# Patient Record
Sex: Female | Born: 1980 | Race: White | Hispanic: No | Marital: Single | Smoking: Former smoker
Health system: Southern US, Community
[De-identification: ages and names within clinical notes are randomized; demographics above are authoritative.]

## PROBLEM LIST (undated history)

## (undated) DIAGNOSIS — F319 Bipolar disorder, unspecified: Secondary | ICD-10-CM

## (undated) DIAGNOSIS — Q8789 Other specified congenital malformation syndromes, not elsewhere classified: Secondary | ICD-10-CM

## (undated) DIAGNOSIS — F988 Other specified behavioral and emotional disorders with onset usually occurring in childhood and adolescence: Secondary | ICD-10-CM

## (undated) DIAGNOSIS — M5430 Sciatica, unspecified side: Secondary | ICD-10-CM

## (undated) DIAGNOSIS — Q528 Other specified congenital malformations of female genitalia: Secondary | ICD-10-CM

## (undated) HISTORY — DX: Other specified congenital malformation syndromes, not elsewhere classified: Q87.89

## (undated) HISTORY — DX: Other specified behavioral and emotional disorders with onset usually occurring in childhood and adolescence: F98.8

## (undated) HISTORY — DX: Other specified congenital malformations of female genitalia: Q52.8

## (undated) HISTORY — PX: OOPHORECTOMY: SHX86

## (undated) HISTORY — DX: Bipolar disorder, unspecified: F31.9

## (undated) HISTORY — PX: MANDIBLE RECONSTRUCTION: SHX431

## (undated) HISTORY — PX: HIP SURGERY: SHX245

---

## 1999-05-08 ENCOUNTER — Emergency Department (HOSPITAL_COMMUNITY): Admission: EM | Admit: 1999-05-08 | Discharge: 1999-05-08 | Payer: Self-pay | Admitting: Emergency Medicine

## 1999-05-08 ENCOUNTER — Encounter: Payer: Self-pay | Admitting: Emergency Medicine

## 1999-09-19 ENCOUNTER — Encounter: Payer: Self-pay | Admitting: Family Medicine

## 1999-09-19 ENCOUNTER — Ambulatory Visit (HOSPITAL_COMMUNITY): Admission: RE | Admit: 1999-09-19 | Discharge: 1999-09-19 | Payer: Self-pay | Admitting: Family Medicine

## 2001-04-14 ENCOUNTER — Emergency Department (HOSPITAL_COMMUNITY): Admission: EM | Admit: 2001-04-14 | Discharge: 2001-04-14 | Payer: Self-pay | Admitting: Internal Medicine

## 2001-04-14 ENCOUNTER — Encounter: Payer: Self-pay | Admitting: Internal Medicine

## 2001-09-26 ENCOUNTER — Emergency Department (HOSPITAL_COMMUNITY): Admission: AC | Admit: 2001-09-26 | Discharge: 2001-09-27 | Payer: Self-pay

## 2001-09-26 ENCOUNTER — Encounter: Payer: Self-pay | Admitting: Surgery

## 2001-09-26 ENCOUNTER — Encounter: Payer: Self-pay | Admitting: Emergency Medicine

## 2002-11-12 ENCOUNTER — Emergency Department (HOSPITAL_COMMUNITY): Admission: EM | Admit: 2002-11-12 | Discharge: 2002-11-12 | Payer: Self-pay | Admitting: Emergency Medicine

## 2003-01-02 ENCOUNTER — Encounter: Payer: Self-pay | Admitting: Emergency Medicine

## 2003-01-02 ENCOUNTER — Emergency Department (HOSPITAL_COMMUNITY): Admission: EM | Admit: 2003-01-02 | Discharge: 2003-01-02 | Payer: Self-pay | Admitting: Emergency Medicine

## 2003-07-09 ENCOUNTER — Encounter: Payer: Self-pay | Admitting: Obstetrics and Gynecology

## 2003-07-09 ENCOUNTER — Ambulatory Visit (HOSPITAL_COMMUNITY): Admission: RE | Admit: 2003-07-09 | Discharge: 2003-07-09 | Payer: Self-pay | Admitting: Obstetrics and Gynecology

## 2003-07-22 ENCOUNTER — Inpatient Hospital Stay (HOSPITAL_COMMUNITY): Admission: RE | Admit: 2003-07-22 | Discharge: 2003-07-24 | Payer: Self-pay | Admitting: Obstetrics and Gynecology

## 2003-07-22 ENCOUNTER — Encounter (INDEPENDENT_AMBULATORY_CARE_PROVIDER_SITE_OTHER): Payer: Self-pay | Admitting: Specialist

## 2003-11-29 ENCOUNTER — Emergency Department (HOSPITAL_COMMUNITY): Admission: EM | Admit: 2003-11-29 | Discharge: 2003-11-29 | Payer: Self-pay | Admitting: Emergency Medicine

## 2004-10-05 ENCOUNTER — Ambulatory Visit: Payer: Self-pay | Admitting: Internal Medicine

## 2005-01-25 ENCOUNTER — Emergency Department (HOSPITAL_COMMUNITY): Admission: EM | Admit: 2005-01-25 | Discharge: 2005-01-25 | Payer: Self-pay | Admitting: Emergency Medicine

## 2005-11-27 ENCOUNTER — Emergency Department (HOSPITAL_COMMUNITY): Admission: EM | Admit: 2005-11-27 | Discharge: 2005-11-27 | Payer: Self-pay | Admitting: Emergency Medicine

## 2006-04-29 ENCOUNTER — Emergency Department (HOSPITAL_COMMUNITY): Admission: EM | Admit: 2006-04-29 | Discharge: 2006-04-29 | Payer: Self-pay | Admitting: Emergency Medicine

## 2007-05-05 ENCOUNTER — Inpatient Hospital Stay (HOSPITAL_COMMUNITY): Admission: EM | Admit: 2007-05-05 | Discharge: 2007-05-10 | Payer: Self-pay | Admitting: Emergency Medicine

## 2008-03-12 ENCOUNTER — Emergency Department (HOSPITAL_COMMUNITY): Admission: EM | Admit: 2008-03-12 | Discharge: 2008-03-12 | Payer: Self-pay | Admitting: Emergency Medicine

## 2008-04-12 ENCOUNTER — Emergency Department (HOSPITAL_COMMUNITY): Admission: EM | Admit: 2008-04-12 | Discharge: 2008-04-12 | Payer: Self-pay | Admitting: Emergency Medicine

## 2008-05-10 ENCOUNTER — Emergency Department (HOSPITAL_COMMUNITY): Admission: EM | Admit: 2008-05-10 | Discharge: 2008-05-10 | Payer: Self-pay | Admitting: Emergency Medicine

## 2008-08-23 ENCOUNTER — Emergency Department (HOSPITAL_COMMUNITY): Admission: EM | Admit: 2008-08-23 | Discharge: 2008-08-23 | Payer: Self-pay | Admitting: Emergency Medicine

## 2008-09-20 ENCOUNTER — Emergency Department (HOSPITAL_COMMUNITY): Admission: EM | Admit: 2008-09-20 | Discharge: 2008-09-20 | Payer: Self-pay | Admitting: Emergency Medicine

## 2008-10-10 ENCOUNTER — Emergency Department (HOSPITAL_COMMUNITY): Admission: EM | Admit: 2008-10-10 | Discharge: 2008-10-10 | Payer: Self-pay | Admitting: Emergency Medicine

## 2009-12-03 ENCOUNTER — Emergency Department (HOSPITAL_COMMUNITY): Admission: EM | Admit: 2009-12-03 | Discharge: 2009-12-03 | Payer: Self-pay | Admitting: Emergency Medicine

## 2009-12-19 ENCOUNTER — Emergency Department (HOSPITAL_COMMUNITY): Admission: EM | Admit: 2009-12-19 | Discharge: 2009-12-19 | Payer: Self-pay | Admitting: Emergency Medicine

## 2010-01-29 ENCOUNTER — Emergency Department (HOSPITAL_COMMUNITY): Admission: EM | Admit: 2010-01-29 | Discharge: 2010-01-29 | Payer: Self-pay | Admitting: Emergency Medicine

## 2010-01-30 ENCOUNTER — Emergency Department (HOSPITAL_COMMUNITY): Admission: EM | Admit: 2010-01-30 | Discharge: 2010-01-30 | Payer: Self-pay | Admitting: Emergency Medicine

## 2010-07-29 ENCOUNTER — Emergency Department (HOSPITAL_COMMUNITY): Admission: EM | Admit: 2010-07-29 | Discharge: 2010-07-29 | Payer: Self-pay | Admitting: Emergency Medicine

## 2010-12-02 ENCOUNTER — Emergency Department (HOSPITAL_COMMUNITY)
Admission: EM | Admit: 2010-12-02 | Discharge: 2010-12-02 | Payer: Self-pay | Source: Home / Self Care | Admitting: Emergency Medicine

## 2011-03-13 LAB — CBC
HCT: 44.9 % (ref 36.0–46.0)
MCHC: 34.7 g/dL (ref 30.0–36.0)
Platelets: 217 10*3/uL (ref 150–400)
RDW: 12.8 % (ref 11.5–15.5)
WBC: 7.1 10*3/uL (ref 4.0–10.5)

## 2011-03-13 LAB — BASIC METABOLIC PANEL
BUN: 11 mg/dL (ref 6–23)
Calcium: 9.6 mg/dL (ref 8.4–10.5)
Creatinine, Ser: 0.72 mg/dL (ref 0.4–1.2)
GFR calc non Af Amer: >60 mL/min (ref >60)
Glucose, Bld: 108 mg/dL — ABNORMAL HIGH (ref 70–99)
Potassium: 3.8 mEq/L (ref 3.5–5.1)

## 2011-05-12 ENCOUNTER — Emergency Department (HOSPITAL_COMMUNITY)
Admission: EM | Admit: 2011-05-12 | Discharge: 2011-05-13 | Disposition: A | Discharge status: Home / Self Care | Payer: Self-pay | Attending: Emergency Medicine | Admitting: Emergency Medicine

## 2011-05-12 DIAGNOSIS — R071 Chest pain on breathing: Secondary | ICD-10-CM | POA: Insufficient documentation

## 2011-05-12 DIAGNOSIS — R079 Chest pain, unspecified: Secondary | ICD-10-CM | POA: Insufficient documentation

## 2011-05-12 DIAGNOSIS — I498 Other specified cardiac arrhythmias: Secondary | ICD-10-CM | POA: Insufficient documentation

## 2011-05-12 DIAGNOSIS — F411 Generalized anxiety disorder: Secondary | ICD-10-CM | POA: Insufficient documentation

## 2011-05-12 DIAGNOSIS — R109 Unspecified abdominal pain: Secondary | ICD-10-CM | POA: Insufficient documentation

## 2011-05-13 ENCOUNTER — Emergency Department (HOSPITAL_COMMUNITY): Payer: Self-pay

## 2011-05-13 LAB — CBC
HCT: 43.1 % (ref 36.0–46.0)
Hemoglobin: 14.7 g/dL (ref 12.0–15.0)
MCHC: 34.1 g/dL (ref 30.0–36.0)
MCV: 88.9 fL (ref 78.0–100.0)
RDW: 13 % (ref 11.5–15.5)

## 2011-05-13 LAB — DIFFERENTIAL
Basophils Absolute: 0 10*3/uL (ref 0.0–0.1)
Basophils Relative: 0 % (ref 0–1)
Lymphocytes Relative: 28 % (ref 12–46)
Monocytes Absolute: 0.5 10*3/uL (ref 0.1–1.0)
Neutro Abs: 6.2 10*3/uL (ref 1.7–7.7)

## 2011-05-13 LAB — COMPREHENSIVE METABOLIC PANEL
ALT: 18 U/L (ref 0–35)
AST: 19 U/L (ref 0–37)
Albumin: 4.4 g/dL (ref 3.5–5.2)
CO2: 26 mEq/L (ref 19–32)
Calcium: 9.3 mg/dL (ref 8.4–10.5)
Chloride: 100 mEq/L (ref 96–112)
GFR calc Af Amer: >60 mL/min (ref >60)
GFR calc non Af Amer: >60 mL/min (ref >60)
Sodium: 137 mEq/L (ref 135–145)
Total Bilirubin: 0.3 mg/dL (ref 0.3–1.2)

## 2011-05-13 LAB — URINALYSIS, ROUTINE W REFLEX MICROSCOPIC
Bilirubin Urine: NEGATIVE
Glucose, UA: NEGATIVE mg/dL
Hgb urine dipstick: NEGATIVE
Specific Gravity, Urine: 1.018 (ref 1.005–1.030)
Urobilinogen, UA: 0.2 mg/dL (ref 0.0–1.0)

## 2011-05-13 LAB — D-DIMER, QUANTITATIVE: D-Dimer, Quant: <0.22 ug/mL-FEU (ref 0.00–0.48)

## 2011-05-13 LAB — POCT PREGNANCY, URINE: Preg Test, Ur: NEGATIVE

## 2011-05-18 NOTE — Op Note (Signed)
NAME:  Debra Curtis, Debra Curtis                    ACCOUNT NO.:  [id number]   MEDICAL RECORD NO.:  [id number]                   PATIENT TYPE:  INP   LOCATION:  9303                                 FACILITY:  [facility]   PHYSICIAN:  Miguel Aschoff, M.D.                    DATE OF BIRTH:  [birth year]   DATE OF PROCEDURE:  07/22/2003  DATE OF DISCHARGE:                                 OPERATIVE REPORT   PREOPERATIVE DIAGNOSIS:  Androgen insensitivity syndrome, removal of gonads  for malignant potential.   POSTOPERATIVE DIAGNOSIS:  Androgen insensitivity syndrome, removal of gonads  for malignant potential.   OPERATION PERFORMED:  Laparoscopy with bilateral orchidectomy.   SURGEON:  Miguel Aschoff, M.D.   ASSISTANT:  Randye Lobo, M.D.   ANESTHESIA:  General.   COMPLICATIONS:  None.   INDICATIONS FOR PROCEDURE:  The patient is a 30 year old white female with  androgen insensitivity syndrome diagnosed at age 57.  The patient now being  22 and the 25% risk of malignancy due to an intra-abdominal gonad, she is  proceeding at this point to undergo removal of gonads in an effort to avoid  this malignant potential. The risks and benefits of this procedure were  discussed with the patient and informed consent has been obtained.   DESCRIPTION OF PROCEDURE:  The patient was taken to the operating room and  placed in the supine position.  General anesthesia was administered without  difficulty.  She was then placed in dorsal lithotomy position, prepped and  draped in the usual sterile fashion.  Bladder was catheterized.  Examination  under anesthesia revealed normal external female genitalia, normal vaginal  vault ending in a blind cuff.  The cervix and uterus were absent.  After  this examination was carried out, attention was directed to the abdomen  where a small infraumbilical incision was made.  The Veress needle was  inserted and then the abdomen was insufflated with 3L of CO2.  Following  this,  a trocar to the laparoscope was placed followed by the laparoscope  itself.  To allow better visualization, two additional port sites were  placed under direct visualization.  A 12 mm right lower quadrant port site  was established as well as a 5 mm left lower quadrant port site.  Systematic  inspection revealed absence of the cervix, uterus, tubes and ovaries.  The  liver surface appeared to be unremarkable, intestinal surfaces appeared to  be unremarkable.  The appendix was visualized and noted to be within normal  limits.  Gonads were visualized just at the exit point at the inguinal ring  but these were intra-abdominal.  No abnormalities or evidence of any  malignant degeneration was noted.  At this point the ureters were identified  as well as the iliac artery and vein and then on the left side, the vessels  into the left gonad were visualized, placed on stretch and then using  tripolar forceps, the vessels were coagulated, then cut.  It was then  possible to dissect the gonad back towards the opening of the internal  inguinal ring until the base was found.  At this point using the tripolar  cautery forceps, it was possible to coagulate and cut, thus freeing the left  gonad.  Inspection of the operative site at this point revealed good  hemostasis and again no evidence of any injury to the pelvic vessels or the  ureter.  A similar procedure was then identically carried out on the right  side.  The vessels entering the gonad were then identified, placed on  stretch and cauterized with the tripolar forceps and then cut.  This was  done without difficulty and then the dissection was carried back towards the  opening of the internal inguinal ring using the tripolar forceps.  The final  pedicle which was holding the gonad in at the level of the inguinal ring was  then placed on stretch and cauterized with tripolar forceps and then cut.  At this point with both gonads being free, an EndoCatch  bag was placed into  the pelvis and the gonads were placed into the EndoCatch bag.  This was then  poked through the 12 mm port site and held up against the abdominal wall.  At this point the small right lower quadrant incision was enlarged by about  1 inch.  The fascia was identified and the incision in the fascia was  extended and it was possible using an EndoCatch bag to remove the specimens  for histologic study.  At this point a final inspection was made for  hemostasis.  Hemostasis appeared to be excellent and at this point the  procedure was completed.  The right lower quadrant incision was closed.  The  peritoneum was then closed and then the fascial incision which was  approximately 3 cm in size was closed using running interlocking 0 Vicryl  suture.  Then the small laparoscopic incisions were closed using  subcuticular 4-0 Vicryl.  The procedure being completed, the patient was  taken to the recovery room in satisfactory condition.  She is to be observed  overnight and will be sent home on June 23, 2003.  The estimated blood loss  was less than 30mL.  The patient tolerated the procedure well.                                                Miguel Aschoff, M.D.    AR/MEDQ  D:  07/22/2003  T:  07/23/2003  Job:  [id number]
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
## 2011-05-18 NOTE — Discharge Summary (Signed)
   NAME:  Debra Curtis, Debra Curtis                    ACCOUNT NO.:  [id number]   MEDICAL RECORD NO.:  [id number]                   PATIENT TYPE:  INP   LOCATION:  9303                                 FACILITY:  [facility]   PHYSICIAN:  Miguel Aschoff, M.D.                    DATE OF BIRTH:  01-25-1981   DATE OF ADMISSION:  07/22/2003  DATE OF DISCHARGE:  07/24/2003                                 DISCHARGE SUMMARY   ADMISSION DIAGNOSIS:  Androgen insensitivity syndrome.   FINAL DIAGNOSIS:  Androgen insensitivity syndrome.   OPERATIONS AND PROCEDURES:  Laparoscopy with bilateral gonadectomy, general  anesthesia.   BRIEF HISTORY:  The patient is a 30 year old white female with documented  androgen insensitivity syndrome diagnosed at age 35.  Because of the  patient's diagnosis an increased risk for development of carcinoma of the  gonads and with the now completing her full adult growth, she is being taken  to the operating room to undergo gonadectomy in effort to avoid the  development of a malignancy of the gonads.   HOSPITAL COURSE:  Preoperative studies were obtained.  This included  admission hemoglobin of 16.5, white count of 8000.  Chemistry profile was  within normal limits.  PT and PTT were within normal limits.  With general  anesthesia, laparoscopy was carried out and it was possible to perform a  laparoscopic bilateral gonadectomy without difficulty.  The patient was  admitted following surgery but had nausea and required intravenous fluids  and was kept in house until July 24, 2003. She was able to be discharged  home on the second postoperative day in satisfactory condition.  Her  hemoglobin stabilized at 13.7.  She was discharged home in satisfactory  condition.  Instructed to no heavy lifting for approximately three weeks,  call if there are any problems such as fever, pain, or heavy bleeding.  Medications for home included Tylox one every three hours if needed for  pain, Reglan 10  mg one every six hours if needed for nausea, and Climara  patches 0.1 mg.  The final pathology report revealed benign gonads.  There  is no evidence of spermatogenesis or evidence of malignancy.                                               Miguel Aschoff, M.D.    AR/MEDQ  D:  08/23/2003  T:  08/23/2003  Job:  [id number]
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
## 2011-06-29 ENCOUNTER — Emergency Department (HOSPITAL_COMMUNITY)
Admission: EM | Admit: 2011-06-29 | Discharge: 2011-06-30 | Disposition: A | Discharge status: Home / Self Care | Payer: Self-pay | Attending: Emergency Medicine | Admitting: Emergency Medicine

## 2011-06-29 DIAGNOSIS — R059 Cough, unspecified: Secondary | ICD-10-CM | POA: Insufficient documentation

## 2011-06-29 DIAGNOSIS — R093 Abnormal sputum: Secondary | ICD-10-CM | POA: Insufficient documentation

## 2011-06-29 DIAGNOSIS — R05 Cough: Secondary | ICD-10-CM | POA: Insufficient documentation

## 2011-06-29 DIAGNOSIS — J45909 Unspecified asthma, uncomplicated: Secondary | ICD-10-CM | POA: Insufficient documentation

## 2011-06-29 DIAGNOSIS — R0989 Other specified symptoms and signs involving the circulatory and respiratory systems: Secondary | ICD-10-CM | POA: Insufficient documentation

## 2011-06-29 DIAGNOSIS — J40 Bronchitis, not specified as acute or chronic: Secondary | ICD-10-CM | POA: Insufficient documentation

## 2011-06-29 DIAGNOSIS — R079 Chest pain, unspecified: Secondary | ICD-10-CM | POA: Insufficient documentation

## 2011-06-29 DIAGNOSIS — F17200 Nicotine dependence, unspecified, uncomplicated: Secondary | ICD-10-CM | POA: Insufficient documentation

## 2011-06-30 ENCOUNTER — Emergency Department (HOSPITAL_COMMUNITY): Payer: Self-pay

## 2011-07-12 ENCOUNTER — Emergency Department (HOSPITAL_COMMUNITY)
Admission: EM | Admit: 2011-07-12 | Discharge: 2011-07-12 | Disposition: A | Discharge status: Home / Self Care | Payer: Self-pay | Attending: Emergency Medicine | Admitting: Emergency Medicine

## 2011-07-12 DIAGNOSIS — F411 Generalized anxiety disorder: Secondary | ICD-10-CM | POA: Insufficient documentation

## 2011-07-12 DIAGNOSIS — Z0000 Encounter for general adult medical examination without abnormal findings: Secondary | ICD-10-CM | POA: Insufficient documentation

## 2011-07-12 DIAGNOSIS — R002 Palpitations: Secondary | ICD-10-CM | POA: Insufficient documentation

## 2011-07-12 DIAGNOSIS — R0789 Other chest pain: Secondary | ICD-10-CM | POA: Insufficient documentation

## 2011-07-12 DIAGNOSIS — R0609 Other forms of dyspnea: Secondary | ICD-10-CM | POA: Insufficient documentation

## 2011-07-12 DIAGNOSIS — R071 Chest pain on breathing: Secondary | ICD-10-CM | POA: Insufficient documentation

## 2011-07-12 DIAGNOSIS — R0989 Other specified symptoms and signs involving the circulatory and respiratory systems: Secondary | ICD-10-CM | POA: Insufficient documentation

## 2011-09-24 LAB — URINALYSIS, ROUTINE W REFLEX MICROSCOPIC
Glucose, UA: NEGATIVE
Ketones, ur: NEGATIVE
Nitrite: POSITIVE — AB
Specific Gravity, Urine: 1.015
pH: 6.5

## 2011-09-24 LAB — I-STAT 8, (EC8 V) (CONVERTED LAB)
BUN: 11
Bicarbonate: 24.2 — ABNORMAL HIGH
Chloride: 107
Glucose, Bld: 132 — ABNORMAL HIGH
HCT: 44
Operator id: 288331
pCO2, Ven: 39.3 — ABNORMAL LOW
pH, Ven: 7.396 — ABNORMAL HIGH

## 2011-09-24 LAB — URINE MICROSCOPIC-ADD ON

## 2011-09-26 LAB — POCT PREGNANCY, URINE
Operator id: 288831
Preg Test, Ur: NEGATIVE

## 2011-10-01 LAB — URINALYSIS, ROUTINE W REFLEX MICROSCOPIC
Glucose, UA: NEGATIVE
Nitrite: NEGATIVE
Specific Gravity, Urine: 1.015
pH: 6

## 2011-10-01 LAB — DIFFERENTIAL
Basophils Absolute: 0
Basophils Relative: 1
Lymphocytes Relative: 25
Monocytes Relative: 5
Neutro Abs: 5.5
Neutrophils Relative %: 67

## 2011-10-01 LAB — POCT I-STAT, CHEM 8
BUN: 21
Chloride: 104
Glucose, Bld: 133 — ABNORMAL HIGH
HCT: 44
Potassium: 3.8

## 2011-10-01 LAB — POCT PREGNANCY, URINE: Preg Test, Ur: NEGATIVE

## 2011-10-01 LAB — CBC
Hemoglobin: 14.2
MCHC: 33.7
RBC: 4.6

## 2011-10-24 ENCOUNTER — Emergency Department (HOSPITAL_COMMUNITY): Payer: Self-pay

## 2011-10-24 ENCOUNTER — Emergency Department (HOSPITAL_COMMUNITY)
Admission: EM | Admit: 2011-10-24 | Discharge: 2011-10-24 | Disposition: A | Discharge status: Home / Self Care | Payer: Self-pay | Attending: Emergency Medicine | Admitting: Emergency Medicine

## 2011-10-24 DIAGNOSIS — M7989 Other specified soft tissue disorders: Secondary | ICD-10-CM | POA: Insufficient documentation

## 2011-10-24 DIAGNOSIS — S62309A Unspecified fracture of unspecified metacarpal bone, initial encounter for closed fracture: Secondary | ICD-10-CM | POA: Insufficient documentation

## 2011-10-24 DIAGNOSIS — Z8659 Personal history of other mental and behavioral disorders: Secondary | ICD-10-CM | POA: Insufficient documentation

## 2011-10-24 DIAGNOSIS — M79609 Pain in unspecified limb: Secondary | ICD-10-CM | POA: Insufficient documentation

## 2011-10-24 DIAGNOSIS — S6990XA Unspecified injury of unspecified wrist, hand and finger(s), initial encounter: Secondary | ICD-10-CM | POA: Insufficient documentation

## 2011-10-24 DIAGNOSIS — S60229A Contusion of unspecified hand, initial encounter: Secondary | ICD-10-CM | POA: Insufficient documentation

## 2011-10-24 DIAGNOSIS — F319 Bipolar disorder, unspecified: Secondary | ICD-10-CM | POA: Insufficient documentation

## 2011-10-24 DIAGNOSIS — Y92009 Unspecified place in unspecified non-institutional (private) residence as the place of occurrence of the external cause: Secondary | ICD-10-CM | POA: Insufficient documentation

## 2011-10-24 DIAGNOSIS — W208XXA Other cause of strike by thrown, projected or falling object, initial encounter: Secondary | ICD-10-CM | POA: Insufficient documentation

## 2014-04-01 ENCOUNTER — Emergency Department (HOSPITAL_COMMUNITY)
Admission: EM | Admit: 2014-04-01 | Discharge: 2014-04-01 | Disposition: A | Discharge status: Home / Self Care | Payer: Self-pay | Attending: Emergency Medicine | Admitting: Emergency Medicine

## 2014-04-01 ENCOUNTER — Encounter (HOSPITAL_COMMUNITY): Payer: Self-pay | Admitting: Emergency Medicine

## 2014-04-01 DIAGNOSIS — Z79899 Other long term (current) drug therapy: Secondary | ICD-10-CM | POA: Insufficient documentation

## 2014-04-01 DIAGNOSIS — Z5189 Encounter for other specified aftercare: Secondary | ICD-10-CM

## 2014-04-01 DIAGNOSIS — Z760 Encounter for issue of repeat prescription: Secondary | ICD-10-CM | POA: Insufficient documentation

## 2014-04-01 DIAGNOSIS — F17200 Nicotine dependence, unspecified, uncomplicated: Secondary | ICD-10-CM | POA: Insufficient documentation

## 2014-04-01 DIAGNOSIS — Z4801 Encounter for change or removal of surgical wound dressing: Secondary | ICD-10-CM | POA: Insufficient documentation

## 2014-04-01 MED ORDER — OXYCODONE-ACETAMINOPHEN 10-325 MG PO TABS: 1.0000 | ORAL_TABLET | ORAL | Status: DC | PRN

## 2014-04-01 MED ORDER — SILVER SULFADIAZINE 1 % EX CREA
TOPICAL_CREAM | Freq: Every day | CUTANEOUS | Status: DC
  Administered 2014-04-01: 19:00:00 via TOPICAL
  Filled 2014-04-01: qty 85

## 2014-04-01 MED ORDER — SILVER SULFADIAZINE 1 % EX CREA: 1.0000 "application " | TOPICAL_CREAM | Freq: Every day | CUTANEOUS | Status: DC

## 2014-04-01 NOTE — Discharge Instructions (Signed)
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
## 2014-04-03 NOTE — ED Provider Notes (Signed)
Medical screening examination/treatment/procedure(s) were performed by non-physician practitioner and as supervising physician I was immediately available for consultation/collaboration.   EKG Interpretation None        Kiyana Vazguez J. Michelina Mexicano, MD 04/03/14 0857 

## 2014-12-04 ENCOUNTER — Encounter (HOSPITAL_COMMUNITY): Payer: Self-pay | Admitting: Emergency Medicine

## 2014-12-04 ENCOUNTER — Emergency Department (HOSPITAL_COMMUNITY)
Admission: EM | Admit: 2014-12-04 | Discharge: 2014-12-04 | Disposition: A | Discharge status: Home / Self Care | Payer: Self-pay | Attending: Emergency Medicine | Admitting: Emergency Medicine

## 2014-12-04 DIAGNOSIS — M5416 Radiculopathy, lumbar region: Secondary | ICD-10-CM | POA: Insufficient documentation

## 2014-12-04 DIAGNOSIS — Z79899 Other long term (current) drug therapy: Secondary | ICD-10-CM | POA: Insufficient documentation

## 2014-12-04 DIAGNOSIS — Z87828 Personal history of other (healed) physical injury and trauma: Secondary | ICD-10-CM | POA: Insufficient documentation

## 2014-12-04 DIAGNOSIS — Z720 Tobacco use: Secondary | ICD-10-CM | POA: Insufficient documentation

## 2014-12-04 DIAGNOSIS — Z7952 Long term (current) use of systemic steroids: Secondary | ICD-10-CM | POA: Insufficient documentation

## 2014-12-04 MED ORDER — HYDROCODONE-ACETAMINOPHEN 5-325 MG PO TABS: ORAL_TABLET | ORAL | Status: DC

## 2014-12-04 MED ORDER — HYDROCODONE-ACETAMINOPHEN 5-325 MG PO TABS
1.0000 | ORAL_TABLET | Freq: Once | ORAL | Status: AC
  Administered 2014-12-04: 1 via ORAL
  Filled 2014-12-04: qty 1

## 2014-12-04 MED ORDER — METHOCARBAMOL 500 MG PO TABS: 1000.0000 mg | ORAL_TABLET | Freq: Four times a day (QID) | ORAL | Status: DC | PRN

## 2014-12-04 MED ORDER — KETOROLAC TROMETHAMINE 60 MG/2ML IM SOLN
30.0000 mg | Freq: Once | INTRAMUSCULAR | Status: AC
  Administered 2014-12-04: 30 mg via INTRAMUSCULAR
  Filled 2014-12-04: qty 2

## 2014-12-04 MED ORDER — PREDNISONE 50 MG PO TABS: ORAL_TABLET | ORAL | Status: DC

## 2014-12-04 NOTE — ED Notes (Signed)
Pt reports back pain since Thursday. Has had back pain before, but not this bad. Pt has lower back pain with radiation down R hip. ambulatory to triage room

## 2014-12-04 NOTE — Discharge Instructions (Signed)
Please take ibuprofen 400mg  (this is normally 2 over the counter pills) every 6 hours (take with food to minimze stomach irritation).   Take robaxin and/or Vicodin for breakthrough pain, do not drink alcohol, drive, care for children or perfom other critical tasks while taking robaxin and/or Vicodin .  Please follow with your primary care doctor in the next 2 days for a check-up. They must obtain records for further management.     Do not hesitate to return to the emergency room for any new, worsening or concerning symptoms.  Please obtain primary care using resource guide below. But the minute you were seen in the emergency room and that they will need to obtain records for further outpatient management.  Ermelinda Das.reso  Emergency Department Resource Guide 1) Find a Doctor and Pay Out of Pocket Although you won't have to find out who is covered by your insurance plan, it is a good idea to ask around and get recommendations. You will then need to call the office and see if the doctor you have chosen will accept you as a new patient and what types of options they offer for patients who are self-pay. Some doctors offer discounts or will set up payment plans for their patients who do not have insurance, but you will need to ask so you aren't surprised when you get to your appointment.  2) Contact Your Local Health Department Not all health departments have doctors that can see patients for sick visits, but many do, so it is worth a call to see if yours does. If you don't know where your local health department is, you can check in your phone book. The CDC also has a tool to help you locate your state's health department, and many state websites also have listings of all of their local health departments.  3) Find a Walk-in Clinic If your illness is not likely to be very severe or complicated, you may want to try a walk in clinic. These are popping up all over the country in pharmacies, drugstores, and  shopping centers. They're usually staffed by nurse practitioners or physician assistants that have been trained to treat common illnesses and complaints. They're usually fairly quick and inexpensive. However, if you have serious medical issues or chronic medical problems, these are probably not your best option.  No Primary Care Doctor: - Call Health Connect at  [phone] - they can help you locate a primary care doctor that  accepts your insurance, provides certain services, etc. - Physician Referral Service- [phone]  Chronic Pain Problems: Organization         Address  Phone   Notes  [facility]  [phone] Patients need to be referred by their primary care doctor.   Medication Assistance: Organization         Address  Phone   Notes  [facility] Medication [facility] [address] [place], [place] [id number] [phone] --Must be a resident of [place] -- Must have NO insurance coverage whatsoever (no Medicaid/ Medicare, etc.) -- The pt. MUST have a primary care doctor that directs their care regularly and follows them in the community   MedAssist  [phone]   [employer]  [phone]    Agencies that provide inexpensive medical care: Organization         Address  Phone   Notes  [facility]  [phone]   [facility]    (  [phone]   [facility] [place], [place] [address] [phone]   [place] [address] [place] [phone]   Planned Parenthood    [phone]   [facility]    [phone]   [place] and [place] [address], [place] Phone:  [phone], Fax:  [phone] Hours of Operation:  9 am - 6 pm, M-F.  Also accepts Medicaid/Medicare and self-pay.  [facility] for [address], [address], [place] Phone: [phone],  Fax: [phone]. Hours of Operation:  8:30 am - 5:30 pm, M-F.  Also accepts Medicaid and self-pay.  [facility] [place] [address] [place] Phone: [phone]   [place], [place], [place] [phone], Ext. [phone] Mondays & Thursdays: 7-9 AM.  First 15 patients are seen on a first come, first serve basis.    [place] Providers:  Organization         Address  Phone   Notes  [facility] [address], [place] [phone] Also accepts self-pay patients.  [facility] [id number] [place], [place]  [phone]   [place], [address], [place] [phone]   [facility] [address] [place] [phone]   Lucianne Lei [address], [place]   [phone] Only accepts [place] Access [place] patients after they have their name applied to their card.   Self-Pay (no insurance) in [facility]:  Organization         Address  Phone   Notes  Sickle Cell Patients, [facility] [place] [phone]   [facility] [place] [phone]   [facility] [place]  [place], [place], [place] [phone]   [facility]/Dr. Osei-Bonsu  [address] [place] or [address], [place] [phone] Phone number for both [place] and [place] locations is the same.  [facility] [address], [place] [phone]   [facility] [address] [place] or [address] [phone]   [facility] [address] [place] [phone], phone; [phone], fax Sees patients 1st and 3rd Saturday of every month.  Must not qualify for public or private  insurance (i.e. Medicaid, Medicare, [place] Health Choice, Veterans' Benefits)  Household income should be no more than 200% of the poverty level The clinic cannot treat you if you are pregnant or think you are pregnant  Sexually transmitted diseases are not treated at the clinic.    Dental Care: Organization         Address  Phone  Notes  [facility] [place] [phone] Accepts children up to age 68 who are enrolled in [place] or [place]; pregnant women with a Medicaid card; and children who have applied for Medicaid or [place] Health Choice, but were declined, whose parents can pay a reduced fee at time of service.  [facility] Department of [facility]  [address], [place] [phone] Accepts children up  to age 23 who are enrolled in Medicaid or [place] Health Choice; pregnant women with a Medicaid card; and children who have applied for Medicaid or [place] Health Choice, but were declined, whose parents can pay a reduced fee at time of service.  [place] Adult Dental Access PROGRAM  [place] [phone] Patients are seen by appointment only. Walk-ins are not accepted. [place] will see patients 26 years of age and older. Monday - Tuesday (8am-5pm) Most Wednesdays (8:30-5pm) $30 per visit, cash only  [facility] Adult Dental Access PROGRAM  [address], [facility] [phone] Patients are seen by appointment only. Walk-ins are not accepted. [place] will see patients 6 years of age and older. One Wednesday Evening (Monthly: Volunteer Based).  $30 per visit, cash only  [place]  [phone] for adults; Children under age 34, call Graduate Pediatric Dentistry at [phone]. Children aged 51-14, please call [phone] to request a pediatric application.  Dental services are provided in all areas of dental care  including fillings, crowns and bridges, complete and partial dentures, implants, gum treatment, root canals, and extractions. Preventive care is also provided. Treatment is provided to both adults and children. Patients are selected via a lottery and there is often a waiting list.   [facility] [address], [place]  [phone] [email or web address]   [facility] [address] [place], [place] [phone], Ext. [phone] Second and Fourth Thursday of each month, opens at 6:30 AM; Clinic ends at 9 AM.  Patients are seen on a first-come first-served basis, and a limited number are seen during each clinic.   [facility]  [address] Hillard Danker [place], [place] [phone]   Eligibility Requirements You must have lived in [place], [place], or [place] counties for at least the last three months.   You cannot be eligible for state or federal sponsored [employer], including [employer], [place], or [employer].   You generally cannot be eligible for healthcare insurance through your employer.    How to apply: Eligibility screenings are held every Tuesday and Wednesday afternoon from 1:00 pm until 4:00 pm. You do not need an appointment for the interview!  [facility] [address] [place], [place]   [place]  [place] Department  [place]  [phone]    Behavioral Health Resources in the Community: Intensive Outpatient Programs Organization         Address  Phone  Notes  [place] [address] [place], [place] [phone]   [facility] Outpatient [address] [place], [place]   ADS: [facility] S[facility] [address] [place], [place]   [place] [address]  [place], [place] or [phone]     Substance Abuse Resources Organization         Address  Phone  Notes  Alcohol and Drug Services  [phone]   [place]  [phone]   [facility] [place]   Chinita Pester  [phone]   Residential & Outpatient Substance Abuse Program  [phone]   Psychological Services Organization         Address  Phone  Notes  Dent  [place]  [place]   [place]  Health [address], [place] or [phone]    Mobile Crisis Teams Organization         Address  Phone  Notes  [facility], Mobile Crisis Care Unit  [phone]   [facility]ive Psychotherapeutic Services  [address] [place], [place]   [place] [address], [place] [phone]    Self-Help/Support Groups Organization         Address  Phone             Notes  [place] of [place] - variety of support groups  [place] Call for more information  Narcotics Anonymous (NA), [facility] [address], [employer] [place]  2 meetings at this location   Special educational needs teacher         Address  Phone  Notes  [facility] [place],    [place]  [phone]   [facility]  [address], [place] [id number], [place], [place]   [place], [place] [phone] Admissions: 8am-3pm M-F  Incentives Substance [place] [address]    [place], [place] [phone]   [facility] [address] [place], [place], [place]   [facility] Psychiatric [address]  [place], [place]   [facility] Programs - Intensive Outpatient [address], [place], [place]   [facility] ([place].) [place].,  [place], [place] [phone] or [phone]   Residential Treatment  Services (RTS) [address], [place], [place] Accepts Medicaid  [facility] [place] [address],  [place] [phone] Substance Abuse/Addiction Treatment   [facility] Organization         Address  Phone  Notes  [facility]  [phone]   Domenic Schwab, PhD [address] Arlis Porta [place], [place]   [phone] or [phone]   [place], [place] [phone]   [facility] [address] [place], [place] [phone] Insurance/Medicaid/sponsorship through [facility] and Families [address] [place]                                    [place], [place] [phone] [place] [address], [place] [phone]    Dr. Adele Schilder  [phone]   [facility] Dept. 1) [address] [place] 2) [place] 3)  [place] [address], [place] [phone]  [phone]   [place] [phone] or [phone] (After Hours)
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
## 2015-04-16 ENCOUNTER — Encounter (HOSPITAL_COMMUNITY): Payer: Self-pay | Admitting: Emergency Medicine

## 2015-04-16 ENCOUNTER — Emergency Department (HOSPITAL_COMMUNITY)
Admission: EM | Admit: 2015-04-16 | Discharge: 2015-04-16 | Disposition: A | Discharge status: Home / Self Care | Payer: Self-pay | Attending: Emergency Medicine | Admitting: Emergency Medicine

## 2015-04-16 ENCOUNTER — Emergency Department (HOSPITAL_COMMUNITY): Payer: Self-pay

## 2015-04-16 DIAGNOSIS — Z7952 Long term (current) use of systemic steroids: Secondary | ICD-10-CM | POA: Insufficient documentation

## 2015-04-16 DIAGNOSIS — M533 Sacrococcygeal disorders, not elsewhere classified: Secondary | ICD-10-CM | POA: Insufficient documentation

## 2015-04-16 DIAGNOSIS — M545 Low back pain: Secondary | ICD-10-CM | POA: Insufficient documentation

## 2015-04-16 DIAGNOSIS — Z720 Tobacco use: Secondary | ICD-10-CM | POA: Insufficient documentation

## 2015-04-16 DIAGNOSIS — Z792 Long term (current) use of antibiotics: Secondary | ICD-10-CM | POA: Insufficient documentation

## 2015-04-16 MED ORDER — KETOROLAC TROMETHAMINE 60 MG/2ML IM SOLN
60.0000 mg | Freq: Once | INTRAMUSCULAR | Status: AC
  Administered 2015-04-16: 60 mg via INTRAMUSCULAR
  Filled 2015-04-16: qty 2

## 2015-04-16 MED ORDER — TRAMADOL HCL 50 MG PO TABS
50.0000 mg | ORAL_TABLET | Freq: Four times a day (QID) | ORAL | Status: DC | PRN
Start: 2015-04-16 — End: 2015-05-16

## 2015-04-16 MED ORDER — LIDOCAINE 5 % EX PTCH: 1.0000 | MEDICATED_PATCH | CUTANEOUS | Status: DC

## 2015-04-16 MED ORDER — DEXAMETHASONE SODIUM PHOSPHATE 10 MG/ML IJ SOLN
10.0000 mg | Freq: Once | INTRAMUSCULAR | Status: AC
  Administered 2015-04-16: 10 mg via INTRAMUSCULAR
  Filled 2015-04-16: qty 1

## 2015-04-16 MED ORDER — PREDNISONE 20 MG PO TABS: ORAL_TABLET | ORAL | Status: DC

## 2015-04-16 MED ORDER — NAPROXEN 500 MG PO TABS: 500.0000 mg | ORAL_TABLET | Freq: Two times a day (BID) | ORAL | Status: DC
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
## 2015-04-16 NOTE — ED Notes (Signed)
Pt c/o mild nausea "from the pain medications, I don't have anything in my stomach".  Pt given crackers and ginger ale, tolerating without issue at this time and denies further needs/complaints.

## 2015-04-16 NOTE — Discharge Instructions (Signed)
Sacroiliac Joint Dysfunction °The sacroiliac joint connects the lower part of the spine (the sacrum) with the bones of the pelvis. °CAUSES  °Sometimes, there is no obvious reason for sacroiliac joint dysfunction. Other times, it may occur  °· During pregnancy. °· After injury, such as: °¨ Car accidents. °¨ Sport-related injuries. °¨ Work-related injuries. °· Due to one leg being shorter than the other. °· Due to other conditions that affect the joints, such as: °¨ Rheumatoid arthritis. °¨ Gout. °¨ Psoriasis. °¨ Joint infection (septic arthritis). °SYMPTOMS  °Symptoms may include: °· Pain in the: °¨ Lower back. °¨ Buttocks. °¨ Groin. °¨ Thighs and legs. °· Difficult sitting, standing, walking, lying, bending or lifting. °DIAGNOSIS  °A number of tests may be used to help diagnose the cause of sacroiliac joint dysfunction, including: °· Imaging tests to look for other causes of pain, including: °¨ MRI. °¨ CT scan. °¨ Bone scan. °· Diagnostic injection: During a special x-ray (called fluoroscopy), a needle is put into the sacroiliac joint. A numbing medicine is injected into the joint. If the pain is improved or stopped, the diagnosis of sacroiliac joint dysfunction is more likely. °TREATMENT  °There are a number of types of treatment used for sacroiliac joint dysfunction, including: °· Only take over-the-counter or prescription medicines for pain, discomfort, or fever as directed by your caregiver. °· Medications to relax muscles. °· Rest. Decreasing activity can help cut down on painful muscle spasms and allow the back to heal. °· Application of heat or ice to the lower back may improve muscle spasms and soothe pain. °· Brace. A special back brace, called a sacroiliac belt, can help support the joint while your back is healing. °· Physical therapy can help teach comfortable positions and exercises to strengthen muscles that support the sacroiliac joint. °· Cortisone injections. Injections of steroid medicine into the  joint can help decrease swelling and improve pain. °· Hyaluronic acid injections. This chemical improves lubrication within the sacroiliac joint, thereby decreasing pain. °· Radiofrequency ablation. A special needle is placed into the joint, where it burns away nerves that are carrying pain messages from the joint. °· Surgery. Because pain occurs during movement of the joint, screws and plates may be installed in order to limit or prevent joint motion. °HOME CARE INSTRUCTIONS  °· Take all medications exactly as directed. °· Follow instructions regarding both rest and physical activity, to avoid worsening the pain. °· Do physical therapy exercises exactly as prescribed. °SEEK IMMEDIATE MEDICAL CARE IF: °· You experience increasingly severe pain. °· You develop new symptoms, such as numbness or tingling in your legs or feet. °· You lose bladder or bowel control. °Document Released: 03/15/2009 Document Revised: 03/10/2012 Document Reviewed: 03/15/2009 °ExitCare® Patient Information ©2015 [employer]. This information is not intended to replace advice given to you by your health care provider. Make sure you discuss any questions you have with your health care provider. ° °
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
## 2015-04-16 NOTE — ED Notes (Signed)
Pt ambulatory with slow steady gait to radiology

## 2015-05-16 ENCOUNTER — Emergency Department (HOSPITAL_COMMUNITY)
Admission: EM | Admit: 2015-05-16 | Discharge: 2015-05-16 | Disposition: A | Discharge status: Home / Self Care | Payer: Self-pay | Attending: Emergency Medicine | Admitting: Emergency Medicine

## 2015-05-16 ENCOUNTER — Encounter (HOSPITAL_COMMUNITY): Payer: Self-pay | Admitting: Emergency Medicine

## 2015-05-16 DIAGNOSIS — G8929 Other chronic pain: Secondary | ICD-10-CM | POA: Insufficient documentation

## 2015-05-16 DIAGNOSIS — X58XXXA Exposure to other specified factors, initial encounter: Secondary | ICD-10-CM | POA: Insufficient documentation

## 2015-05-16 DIAGNOSIS — Y998 Other external cause status: Secondary | ICD-10-CM | POA: Insufficient documentation

## 2015-05-16 DIAGNOSIS — S3992XA Unspecified injury of lower back, initial encounter: Secondary | ICD-10-CM | POA: Insufficient documentation

## 2015-05-16 DIAGNOSIS — Z720 Tobacco use: Secondary | ICD-10-CM | POA: Insufficient documentation

## 2015-05-16 DIAGNOSIS — Y9289 Other specified places as the place of occurrence of the external cause: Secondary | ICD-10-CM | POA: Insufficient documentation

## 2015-05-16 DIAGNOSIS — Y9389 Activity, other specified: Secondary | ICD-10-CM | POA: Insufficient documentation

## 2015-05-16 DIAGNOSIS — Z79899 Other long term (current) drug therapy: Secondary | ICD-10-CM | POA: Insufficient documentation

## 2015-05-16 DIAGNOSIS — Z791 Long term (current) use of non-steroidal anti-inflammatories (NSAID): Secondary | ICD-10-CM | POA: Insufficient documentation

## 2015-05-16 DIAGNOSIS — M545 Low back pain, unspecified: Secondary | ICD-10-CM

## 2015-05-16 MED ORDER — TRAMADOL HCL 50 MG PO TABS: 50.0000 mg | ORAL_TABLET | Freq: Four times a day (QID) | ORAL | Status: DC | PRN

## 2015-05-16 MED ORDER — PREDNISONE 20 MG PO TABS
60.0000 mg | ORAL_TABLET | Freq: Once | ORAL | Status: AC
  Administered 2015-05-16: 60 mg via ORAL
  Filled 2015-05-16: qty 3

## 2015-05-16 MED ORDER — TRAMADOL HCL 50 MG PO TABS
50.0000 mg | ORAL_TABLET | Freq: Once | ORAL | Status: AC
  Administered 2015-05-16: 50 mg via ORAL
  Filled 2015-05-16: qty 1

## 2015-05-16 MED ORDER — PREDNISONE 10 MG PO TABS: ORAL_TABLET | ORAL | Status: DC

## 2015-05-16 NOTE — Discharge Instructions (Signed)
Cryotherapy °Cryotherapy means treatment with cold. Ice or gel packs can be used to reduce both pain and swelling. Ice is the most helpful within the first 24 to 48 hours after an injury or flare-up from overusing a muscle or joint. Sprains, strains, spasms, burning pain, shooting pain, and aches can all be eased with ice. Ice can also be used when recovering from surgery. Ice is effective, has very few side effects, and is safe for most people to use. °PRECAUTIONS  °Ice is not a safe treatment option for people with: °· Raynaud phenomenon. This is a condition affecting small blood vessels in the extremities. Exposure to cold may cause your problems to return. °· Cold hypersensitivity. There are many forms of cold hypersensitivity, including: °¨ Cold urticaria. Red, itchy hives appear on the skin when the tissues begin to warm after being iced. °¨ Cold erythema. This is a red, itchy rash caused by exposure to cold. °¨ Cold hemoglobinuria. Red blood cells break down when the tissues begin to warm after being iced. The hemoglobin that carry oxygen are passed into the urine because they cannot combine with blood proteins fast enough. °· Numbness or altered sensitivity in the area being iced. °If you have any of the following conditions, do not use ice until you have discussed cryotherapy with your caregiver: °· Heart conditions, such as arrhythmia, angina, or chronic heart disease. °· High blood pressure. °· Healing wounds or open skin in the area being iced. °· Current infections. °· Rheumatoid arthritis. °· Poor circulation. °· Diabetes. °Ice slows the blood flow in the region it is applied. This is beneficial when trying to stop inflamed tissues from spreading irritating chemicals to surrounding tissues. However, if you expose your skin to cold temperatures for too long or without the proper protection, you can damage your skin or nerves. Watch for signs of skin damage due to cold. °HOME CARE INSTRUCTIONS °Follow  these tips to use ice and cold packs safely. °· Place a dry or damp towel between the ice and skin. A damp towel will cool the skin more quickly, so you may need to shorten the time that the ice is used. °· For a more rapid response, add gentle compression to the ice. °· Ice for no more than 10 to 20 minutes at a time. The bonier the area you are icing, the less time it will take to get the benefits of ice. °· Check your skin after 5 minutes to make sure there are no signs of a poor response to cold or skin damage. °· Rest 20 minutes or more between uses. °· Once your skin is numb, you can end your treatment. You can test numbness by very lightly touching your skin. The touch should be so light that you do not see the skin dimple from the pressure of your fingertip. When using ice, most people will feel these normal sensations in this order: cold, burning, aching, and numbness. °· Do not use ice on someone who cannot communicate their responses to pain, such as small children or people with dementia. °HOW TO MAKE AN ICE PACK °Ice packs are the most common way to use ice therapy. Other methods include ice massage, ice baths, and cryosprays. Muscle creams that cause a cold, tingly feeling do not offer the same benefits that ice offers and should not be used as a substitute unless recommended by your caregiver. °To make an ice pack, do one of the following: °· Place crushed ice or a   bag of frozen vegetables in a sealable plastic bag. Squeeze out the excess air. Place this bag inside another plastic bag. Slide the bag into a pillowcase or place a damp towel between your skin and the bag.  Mix 3 parts water with 1 part rubbing alcohol. Freeze the mixture in a sealable plastic bag. When you remove the mixture from the freezer, it will be slushy. Squeeze out the excess air. Place this bag inside another plastic bag. Slide the bag into a pillowcase or place a damp towel between your skin and the bag. SEEK MEDICAL CARE  IF:  You develop white spots on your skin. This may give the skin a blotchy (mottled) appearance.  Your skin turns blue or pale.  Your skin becomes waxy or hard.  Your swelling gets worse. MAKE SURE YOU:   Understand these instructions.  Will watch your condition.  Will get help right away if you are not doing well or get worse. Document Released: 08/13/2011 Document Revised: 05/03/2014 Document Reviewed: 08/13/2011 [facility] Patient Information 2015 [place], [place]. This information is not intended to replace advice given to you by your health care provider. Make sure you discuss any questions you have with your health care provider. Back Pain, Adult Low back pain is very common. About 1 in 5 people have back pain.The cause of low back pain is rarely dangerous. The pain often gets better over time.About half of people with a sudden onset of back pain feel better in just 2 weeks. About 8 in 10 people feel better by 6 weeks.  CAUSES Some common causes of back pain include:  Strain of the muscles or ligaments supporting the spine.  Wear and tear (degeneration) of the spinal discs.  Arthritis.  Direct injury to the back. DIAGNOSIS Most of the time, the direct cause of low back pain is not known.However, back pain can be treated effectively even when the exact cause of the pain is unknown.Answering your caregiver's questions about your overall health and symptoms is one of the most accurate ways to make sure the cause of your pain is not dangerous. If your caregiver needs more information, he or she may order lab work or imaging tests (X-rays or MRIs).However, even if imaging tests show changes in your back, this usually does not require surgery. HOME CARE INSTRUCTIONS For many people, back pain returns.Since low back pain is rarely dangerous, it is often a condition that people can learn to [facility] Incmanageon their own.   Remain active. It is stressful on the back to sit or stand in one  place. Do not sit, drive, or stand in one place for more than 30 minutes at a time. Take short walks on level surfaces as soon as pain allows.Try to increase the length of time you walk each day.  Do not stay in bed.Resting more than 1 or 2 days can delay your recovery.  Do not avoid exercise or work.Your body is made to move.It is not dangerous to be active, even though your back may hurt.Your back will likely heal faster if you return to being active before your pain is gone.  Pay attention to your body when you bend and lift. Many people have less discomfortwhen lifting if they bend their knees, keep the load close to their bodies,and avoid twisting. Often, the most comfortable positions are those that put less stress on your recovering back.  Find a comfortable position to sleep. Use a firm mattress and lie on your side with your  knees slightly bent. If you lie on your back, put a pillow under your knees.  Only take over-the-counter or prescription medicines as directed by your caregiver. Over-the-counter medicines to reduce pain and inflammation are often the most helpful.Your caregiver may prescribe muscle relaxant drugs.These medicines help dull your pain so you can more quickly return to your normal activities and healthy exercise.  Put ice on the injured area.  Put ice in a plastic bag.  Place a towel between your skin and the bag.  Leave the ice on for 15-20 minutes, 03-04 times a day for the first 2 to 3 days. After that, ice and heat may be alternated to reduce pain and spasms.  Ask your caregiver about trying back exercises and gentle massage. This may be of some benefit.  Avoid feeling anxious or stressed.Stress increases muscle tension and can worsen back pain.It is important to recognize when you are anxious or stressed and learn ways to manage it.Exercise is a great option. SEEK MEDICAL CARE IF:  You have pain that is not relieved with rest or medicine.  You  have pain that does not improve in 1 week.  You have new symptoms.  You are generally not feeling well. SEEK IMMEDIATE MEDICAL CARE IF:   You have pain that radiates from your back into your legs.  You develop new bowel or bladder control problems.  You have unusual weakness or numbness in your arms or legs.  You develop nausea or vomiting.  You develop abdominal pain.  You feel faint. Document Released: 12/17/2005 Document Revised: 06/17/2012 Document Reviewed: 04/20/2014 [facility] SacramentoExitCare Patient Information 2015 [place]ExitCare, [place]LLC. This information is not intended to replace advice given to you by your health care provider. Make sure you discuss any questions you have with your health care provider.

## 2015-05-16 NOTE — ED Notes (Signed)
Pt states she has a hx of back pain and took the prednisone and pain meds last time but yesterday it started hurting again. States she did see an orthopedist and did the physcal therapy exercises but is still hurting. Alert adn oriented.
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
## 2015-05-17 ENCOUNTER — Ambulatory Visit (HOSPITAL_COMMUNITY)
Admission: RE | Admit: 2015-05-17 | Discharge: 2015-05-17 | Disposition: A | Discharge status: Home / Self Care | Payer: Self-pay | Source: Ambulatory Visit | Attending: Emergency Medicine | Admitting: Emergency Medicine

## 2015-05-17 DIAGNOSIS — M545 Low back pain: Secondary | ICD-10-CM | POA: Insufficient documentation

## 2015-05-27 ENCOUNTER — Encounter (HOSPITAL_COMMUNITY): Payer: Self-pay | Admitting: Emergency Medicine

## 2015-05-27 ENCOUNTER — Emergency Department (HOSPITAL_COMMUNITY)
Admission: EM | Admit: 2015-05-27 | Discharge: 2015-05-27 | Disposition: A | Discharge status: Home / Self Care | Payer: No Typology Code available for payment source | Attending: Emergency Medicine | Admitting: Emergency Medicine

## 2015-05-27 DIAGNOSIS — Y92410 Unspecified street and highway as the place of occurrence of the external cause: Secondary | ICD-10-CM | POA: Insufficient documentation

## 2015-05-27 DIAGNOSIS — S3992XA Unspecified injury of lower back, initial encounter: Secondary | ICD-10-CM | POA: Diagnosis present

## 2015-05-27 DIAGNOSIS — Z720 Tobacco use: Secondary | ICD-10-CM | POA: Insufficient documentation

## 2015-05-27 DIAGNOSIS — Z7982 Long term (current) use of aspirin: Secondary | ICD-10-CM | POA: Insufficient documentation

## 2015-05-27 DIAGNOSIS — Z791 Long term (current) use of non-steroidal anti-inflammatories (NSAID): Secondary | ICD-10-CM | POA: Diagnosis not present

## 2015-05-27 DIAGNOSIS — Z79899 Other long term (current) drug therapy: Secondary | ICD-10-CM | POA: Insufficient documentation

## 2015-05-27 DIAGNOSIS — Y9389 Activity, other specified: Secondary | ICD-10-CM | POA: Insufficient documentation

## 2015-05-27 DIAGNOSIS — Z8739 Personal history of other diseases of the musculoskeletal system and connective tissue: Secondary | ICD-10-CM | POA: Insufficient documentation

## 2015-05-27 DIAGNOSIS — M5440 Lumbago with sciatica, unspecified side: Secondary | ICD-10-CM

## 2015-05-27 DIAGNOSIS — Y998 Other external cause status: Secondary | ICD-10-CM | POA: Diagnosis not present

## 2015-05-27 HISTORY — DX: Sciatica, unspecified side: M54.30

## 2015-05-27 MED ORDER — TRAMADOL HCL 50 MG PO TABS: 50.0000 mg | ORAL_TABLET | Freq: Four times a day (QID) | ORAL | Status: DC | PRN

## 2015-05-27 MED ORDER — DIAZEPAM 5 MG PO TABS: 5.0000 mg | ORAL_TABLET | Freq: Two times a day (BID) | ORAL | Status: DC | PRN

## 2015-05-27 MED ORDER — TRAMADOL HCL 50 MG PO TABS
50.0000 mg | ORAL_TABLET | Freq: Once | ORAL | Status: AC
  Administered 2015-05-27: 50 mg via ORAL
  Filled 2015-05-27: qty 1
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
## 2015-06-10 ENCOUNTER — Emergency Department (HOSPITAL_COMMUNITY)
Admission: EM | Admit: 2015-06-10 | Discharge: 2015-06-10 | Disposition: A | Discharge status: Home / Self Care | Payer: No Typology Code available for payment source | Attending: Emergency Medicine | Admitting: Emergency Medicine

## 2015-06-10 ENCOUNTER — Encounter (HOSPITAL_COMMUNITY): Payer: Self-pay | Admitting: Emergency Medicine

## 2015-06-10 DIAGNOSIS — G8929 Other chronic pain: Secondary | ICD-10-CM | POA: Diagnosis not present

## 2015-06-10 DIAGNOSIS — Z720 Tobacco use: Secondary | ICD-10-CM | POA: Diagnosis not present

## 2015-06-10 DIAGNOSIS — M546 Pain in thoracic spine: Secondary | ICD-10-CM | POA: Diagnosis present

## 2015-06-10 DIAGNOSIS — Z87828 Personal history of other (healed) physical injury and trauma: Secondary | ICD-10-CM | POA: Diagnosis not present

## 2015-06-10 DIAGNOSIS — Z79899 Other long term (current) drug therapy: Secondary | ICD-10-CM | POA: Insufficient documentation

## 2015-06-10 DIAGNOSIS — Z792 Long term (current) use of antibiotics: Secondary | ICD-10-CM | POA: Diagnosis not present

## 2015-06-10 MED ORDER — TRAMADOL HCL 50 MG PO TABS: 50.0000 mg | ORAL_TABLET | Freq: Four times a day (QID) | ORAL | Status: DC | PRN
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
## 2015-06-10 NOTE — Discharge Instructions (Signed)
Back Exercises °Back exercises help treat and prevent back injuries. The goal of back exercises is to increase the strength of your abdominal and back muscles and the flexibility of your back. These exercises should be started when you no longer have back pain. Back exercises include: °· Pelvic Tilt. Lie on your back with your knees bent. Tilt your pelvis until the lower part of your back is against the floor. Hold this position 5 to 10 sec and repeat 5 to 10 times. °· Knee to Chest. Pull first 1 knee up against your chest and hold for 20 to 30 seconds, repeat this with the other knee, and then both knees. This may be done with the other leg straight or bent, whichever feels better. °· Sit-Ups or Curl-Ups. Bend your knees 90 degrees. Start with tilting your pelvis, and do a partial, slow sit-up, lifting your trunk only 30 to 45 degrees off the floor. Take at least 2 to 3 seconds for each sit-up. Do not do sit-ups with your knees out straight. If partial sit-ups are difficult, simply do the above but with only tightening your abdominal muscles and holding it as directed. °· Hip-Lift. Lie on your back with your knees flexed 90 degrees. Push down with your feet and shoulders as you raise your hips a couple inches off the floor; hold for 10 seconds, repeat 5 to 10 times. °· Back arches. Lie on your stomach, propping yourself up on bent elbows. Slowly press on your hands, causing an arch in your low back. Repeat 3 to 5 times. Any initial stiffness and discomfort should lessen with repetition over time. °· Shoulder-Lifts. Lie face down with arms beside your body. Keep hips and torso pressed to floor as you slowly lift your head and shoulders off the floor. °Do not overdo your exercises, especially in the beginning. Exercises may cause you some mild back discomfort which lasts for a few minutes; however, if the pain is more severe, or lasts for more than 15 minutes, do not continue exercises until you see your caregiver.  Improvement with exercise therapy for back problems is slow.  °See your caregivers for assistance with developing a proper back exercise program. °Document Released: 01/24/2005 Document Revised: 03/10/2012 Document Reviewed: 10/18/2011 °[employer]® Patient Information ©2015 [employer]. This information is not intended to replace advice given to you by your health care provider. Make sure you discuss any questions you have with your health care provider. °Back Pain, Adult °Low back pain is very common. About 1 in 5 people have back pain. The cause of low back pain is rarely dangerous. The pain often gets better over time. About half of people with a sudden onset of back pain feel better in just 2 weeks. About 8 in 10 people feel better by 6 weeks.  °CAUSES °Some common causes of back pain include: °· Strain of the muscles or ligaments supporting the spine. °· Wear and tear (degeneration) of the spinal discs. °· Arthritis. °· Direct injury to the back. °DIAGNOSIS °Most of the time, the direct cause of low back pain is not known. However, back pain can be treated effectively even when the exact cause of the pain is unknown. Answering your caregiver's questions about your overall health and symptoms is one of the most accurate ways to make sure the cause of your pain is not dangerous. If your caregiver needs more information, he or she may order lab work or imaging tests (X-rays or MRIs). However, even if imaging tests show changes in your back,   this usually does not require surgery. HOME CARE INSTRUCTIONS For many people, back pain returns.Since low back pain is rarely dangerous, it is often a condition that people can learn to [facility] their own.   Remain active. It is stressful on the back to sit or stand in one place. Do not sit, drive, or stand in one place for more than 30 minutes at a time. Take short walks on level surfaces as soon as pain allows.Try to increase the length of time you walk each  day.  Do not stay in bed.Resting more than 1 or 2 days can delay your recovery.  Do not avoid exercise or work.Your body is made to move.It is not dangerous to be active, even though your back may hurt.Your back will likely heal faster if you return to being active before your pain is gone.  Pay attention to your body when you bend and lift. Many people have less discomfortwhen lifting if they bend their knees, keep the load close to their bodies,and avoid twisting. Often, the most comfortable positions are those that put less stress on your recovering back.  Find a comfortable position to sleep. Use a firm mattress and lie on your side with your knees slightly bent. If you lie on your back, put a pillow under your knees.  Only take over-the-counter or prescription medicines as directed by your caregiver. Over-the-counter medicines to reduce pain and inflammation are often the most helpful.Your caregiver may prescribe muscle relaxant drugs.These medicines help dull your pain so you can more quickly return to your normal activities and healthy exercise.  Put ice on the injured area.  Put ice in a plastic bag.  Place a towel between your skin and the bag.  Leave the ice on for 15-20 minutes, 03-04 times a day for the first 2 to 3 days. After that, ice and heat may be alternated to reduce pain and spasms.  Ask your caregiver about trying back exercises and gentle massage. This may be of some benefit.  Avoid feeling anxious or stressed.Stress increases muscle tension and can worsen back pain.It is important to recognize when you are anxious or stressed and learn ways to manage it.Exercise is a great option. SEEK MEDICAL CARE IF:  You have pain that is not relieved with rest or medicine.  You have pain that does not improve in 1 week.  You have new symptoms.  You are generally not feeling well. SEEK IMMEDIATE MEDICAL CARE IF:   You have pain that radiates from your back into  your legs.  You develop new bowel or bladder control problems.  You have unusual weakness or numbness in your arms or legs.  You develop nausea or vomiting.  You develop abdominal pain.  You feel faint. Document Released: 12/17/2005 Document Revised: 06/17/2012 Document Reviewed: 04/20/2014 [facility] Patient Information 2015 [place], [place]. This information is not intended to replace advice given to you by your health care provider. Make sure you discuss any questions you have with your health care provider.  Please follow-up with her primary care provider, inform them of your visit today and all relevant information. Please monitor for new or worsening signs or symptoms, follow-up immediately if any present. His use medication only as directed.

## 2015-06-15 ENCOUNTER — Ambulatory Visit: Payer: Self-pay | Attending: Physician Assistant | Admitting: Physician Assistant

## 2015-06-15 VITALS — BP 138/81 | HR 114 | Temp 98.5°F | Resp 18 | Ht 68.0 in | Wt 173.4 lb

## 2015-06-15 DIAGNOSIS — M5441 Lumbago with sciatica, right side: Secondary | ICD-10-CM

## 2015-06-15 MED ORDER — PREDNISONE 10 MG PO TABS: ORAL_TABLET | ORAL | Status: DC

## 2015-06-15 MED ORDER — TRAMADOL HCL 50 MG PO TABS: 50.0000 mg | ORAL_TABLET | Freq: Four times a day (QID) | ORAL | Status: DC | PRN

## 2015-06-15 NOTE — Progress Notes (Signed)
Patient here for ED follow up for sciatic-like pain.  Patient states the pain radiates from her tailbone up to her neck.  Patient rates back pain 10/10, describes as throbbing/shooting, and "like a donkey-kick" to the tailbone.  Patient reports only relief is combination of Prednisone and Tramadol.  Patient has no further complaints at this time.
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
## 2015-06-22 ENCOUNTER — Encounter: Payer: Self-pay | Admitting: Family Medicine

## 2015-06-22 NOTE — Telephone Encounter (Addendum)
Error

## 2015-06-27 ENCOUNTER — Telehealth: Payer: Self-pay | Admitting: Internal Medicine

## 2015-06-27 NOTE — Telephone Encounter (Signed)
Pt requesting to speak to nurse regarding predniSONE (DELTASONE) 10 MG tablet script, pt states there was a discrepancy between directions and number of tablets, in the end she says she was missing 5 tablets and missed a days worth of medication. Pt states back went out over the weekend and says medication helps clear up back pain/stiffness.   Also pt requesting refill on pain medication.

## 2015-07-05 NOTE — Telephone Encounter (Signed)
Original prednisone prescription was initiated in ED, 6 pills were given in ED. When Debra Curtis reordered prescription for prednisone, prescription was 6 pills short. Patient completed prednisone taper even though it was short 6 pills. Patient is now taking BC powders, 4 a day, for neck, shoulders, down to mid-back. Patient has [facility] on [facility] to [facility].  Patient explains she can wait until that appointment to discuss back pain with provider.

## 2015-07-14 ENCOUNTER — Ambulatory Visit: Payer: Self-pay | Attending: Family Medicine | Admitting: Family Medicine

## 2015-07-14 ENCOUNTER — Encounter: Payer: Self-pay | Admitting: Family Medicine

## 2015-07-14 VITALS — BP 101/68 | HR 93 | Temp 98.3°F | Resp 18 | Ht 67.0 in | Wt 173.0 lb

## 2015-07-14 DIAGNOSIS — R05 Cough: Secondary | ICD-10-CM

## 2015-07-14 DIAGNOSIS — R059 Cough, unspecified: Secondary | ICD-10-CM | POA: Insufficient documentation

## 2015-07-14 DIAGNOSIS — F39 Unspecified mood [affective] disorder: Secondary | ICD-10-CM

## 2015-07-14 DIAGNOSIS — Q8789 Other specified congenital malformation syndromes, not elsewhere classified: Secondary | ICD-10-CM | POA: Insufficient documentation

## 2015-07-14 DIAGNOSIS — F17200 Nicotine dependence, unspecified, uncomplicated: Secondary | ICD-10-CM | POA: Insufficient documentation

## 2015-07-14 DIAGNOSIS — M546 Pain in thoracic spine: Secondary | ICD-10-CM

## 2015-07-14 DIAGNOSIS — Z720 Tobacco use: Secondary | ICD-10-CM

## 2015-07-14 DIAGNOSIS — Q528 Other specified congenital malformations of female genitalia: Secondary | ICD-10-CM | POA: Insufficient documentation

## 2015-07-14 MED ORDER — ALBUTEROL SULFATE HFA 108 (90 BASE) MCG/ACT IN AERS: 2.0000 | INHALATION_SPRAY | Freq: Four times a day (QID) | RESPIRATORY_TRACT | Status: DC | PRN

## 2015-07-14 MED ORDER — AZITHROMYCIN 250 MG PO TABS: ORAL_TABLET | ORAL | Status: DC

## 2015-07-14 MED ORDER — TRAMADOL HCL 50 MG PO TABS
50.0000 mg | ORAL_TABLET | Freq: Three times a day (TID) | ORAL | Status: DC | PRN
Start: 2015-07-14 — End: 2015-11-01

## 2015-07-14 MED ORDER — DICLOFENAC SODIUM 75 MG PO TBEC: 75.0000 mg | DELAYED_RELEASE_TABLET | Freq: Two times a day (BID) | ORAL | Status: DC

## 2015-07-14 NOTE — Assessment & Plan Note (Addendum)
Cough: suspect mild bronchitis  CXR Azithromycin ordered Albuterol ordered
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
## 2015-07-15 ENCOUNTER — Telehealth: Payer: Self-pay | Admitting: Family Medicine

## 2015-07-15 DIAGNOSIS — R05 Cough: Secondary | ICD-10-CM

## 2015-07-15 DIAGNOSIS — M546 Pain in thoracic spine: Secondary | ICD-10-CM

## 2015-07-15 DIAGNOSIS — R059 Cough, unspecified: Secondary | ICD-10-CM

## 2015-07-15 NOTE — Telephone Encounter (Signed)
Patient called stating that she is upset about her medications being sent to the wrong pharmacy. Patient stated that she is now unable to get the medications until Monday or Tuesday when the pharmacy is able to get them transferred. Please f/u with pt.

## 2015-07-19 NOTE — Telephone Encounter (Signed)
Patient called stating that she was not able to get her medications and the pharmacy needs for the medications that were sent to [facility] to be re-[facility] to [facility] filled. Please f/u

## 2015-07-20 MED ORDER — DICLOFENAC SODIUM 75 MG PO TBEC: 75.0000 mg | DELAYED_RELEASE_TABLET | Freq: Two times a day (BID) | ORAL | Status: DC

## 2015-07-20 MED ORDER — AZITHROMYCIN 250 MG PO TABS: ORAL_TABLET | ORAL | Status: DC

## 2015-07-20 MED ORDER — ALBUTEROL SULFATE HFA 108 (90 BASE) MCG/ACT IN AERS: 2.0000 | INHALATION_SPRAY | Freq: Four times a day (QID) | RESPIRATORY_TRACT | Status: DC | PRN

## 2015-07-20 NOTE — Telephone Encounter (Signed)
Rx resend to [facility]  Pt aware

## 2015-08-12 ENCOUNTER — Ambulatory Visit: Payer: Self-pay

## 2015-09-23 ENCOUNTER — Encounter (HOSPITAL_COMMUNITY): Payer: Self-pay | Admitting: Emergency Medicine

## 2015-09-23 ENCOUNTER — Emergency Department (HOSPITAL_COMMUNITY): Payer: Self-pay

## 2015-09-23 ENCOUNTER — Emergency Department (HOSPITAL_COMMUNITY)
Admission: EM | Admit: 2015-09-23 | Discharge: 2015-09-23 | Disposition: A | Discharge status: Home / Self Care | Payer: Self-pay | Attending: Emergency Medicine | Admitting: Emergency Medicine

## 2015-09-23 DIAGNOSIS — Z791 Long term (current) use of non-steroidal anti-inflammatories (NSAID): Secondary | ICD-10-CM | POA: Insufficient documentation

## 2015-09-23 DIAGNOSIS — Z8659 Personal history of other mental and behavioral disorders: Secondary | ICD-10-CM | POA: Insufficient documentation

## 2015-09-23 DIAGNOSIS — M25451 Effusion, right hip: Secondary | ICD-10-CM | POA: Insufficient documentation

## 2015-09-23 DIAGNOSIS — Z79899 Other long term (current) drug therapy: Secondary | ICD-10-CM | POA: Insufficient documentation

## 2015-09-23 DIAGNOSIS — Z720 Tobacco use: Secondary | ICD-10-CM | POA: Insufficient documentation

## 2015-09-23 DIAGNOSIS — Z9889 Other specified postprocedural states: Secondary | ICD-10-CM | POA: Insufficient documentation

## 2015-09-23 DIAGNOSIS — M25551 Pain in right hip: Secondary | ICD-10-CM | POA: Insufficient documentation

## 2015-09-23 NOTE — ED Notes (Addendum)
Pt reports one week hx of throbbing, stabbing pain in r/hip . Pt denies trauma. Hip pain increased with ambulation, radiates to groin and r/buttock Took Tramadol and NSAID, no relief

## 2015-09-23 NOTE — Discharge Instructions (Signed)
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
## 2015-10-28 ENCOUNTER — Encounter (HOSPITAL_COMMUNITY): Payer: Self-pay

## 2015-10-28 ENCOUNTER — Emergency Department (HOSPITAL_COMMUNITY)
Admission: EM | Admit: 2015-10-28 | Discharge: 2015-10-28 | Disposition: A | Discharge status: Home / Self Care | Payer: Self-pay | Attending: Emergency Medicine | Admitting: Emergency Medicine

## 2015-10-28 ENCOUNTER — Emergency Department (HOSPITAL_COMMUNITY): Payer: Self-pay

## 2015-10-28 DIAGNOSIS — Z8659 Personal history of other mental and behavioral disorders: Secondary | ICD-10-CM | POA: Insufficient documentation

## 2015-10-28 DIAGNOSIS — Z8742 Personal history of other diseases of the female genital tract: Secondary | ICD-10-CM | POA: Insufficient documentation

## 2015-10-28 DIAGNOSIS — R319 Hematuria, unspecified: Secondary | ICD-10-CM

## 2015-10-28 DIAGNOSIS — N390 Urinary tract infection, site not specified: Secondary | ICD-10-CM | POA: Insufficient documentation

## 2015-10-28 DIAGNOSIS — Z720 Tobacco use: Secondary | ICD-10-CM | POA: Insufficient documentation

## 2015-10-28 DIAGNOSIS — Z79899 Other long term (current) drug therapy: Secondary | ICD-10-CM | POA: Insufficient documentation

## 2015-10-28 LAB — URINALYSIS, ROUTINE W REFLEX MICROSCOPIC
Bilirubin Urine: NEGATIVE
Glucose, UA: NEGATIVE mg/dL
Ketones, ur: NEGATIVE mg/dL
Nitrite: NEGATIVE
Protein, ur: NEGATIVE mg/dL
SPECIFIC GRAVITY, URINE: 1.015 (ref 1.005–1.030)
Urobilinogen, UA: 0.2 mg/dL (ref 0.0–1.0)
pH: 5.5 (ref 5.0–8.0)

## 2015-10-28 LAB — URINE MICROSCOPIC-ADD ON

## 2015-10-28 MED ORDER — NAPROXEN 500 MG PO TABS: 500.0000 mg | ORAL_TABLET | Freq: Two times a day (BID) | ORAL | Status: DC

## 2015-10-28 MED ORDER — CEPHALEXIN 500 MG PO CAPS: 500.0000 mg | ORAL_CAPSULE | Freq: Three times a day (TID) | ORAL | Status: DC
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
## 2015-10-28 NOTE — ED Notes (Signed)
Pt c/o of rt flank pain that radiated to rt groin pain. Pt c/o of frequency with urination. Pt took Norco at 2pm with some relief however pain still severe.

## 2015-10-28 NOTE — Discharge Instructions (Signed)
Antibiotic Medicine °Antibiotic medicines are used to treat infections caused by bacteria. They work by injuring or killing the bacteria that is making you sick. °HOW IS AN ANTIBIOTIC CHOSEN? °An antibiotic is chosen based on many factors. To help your health care provider choose one for you, tell your health care provider if: °· You have any allergies. °· You are pregnant or plan to get pregnant. °· You are breastfeeding. °· You are taking any medicines. These include over-the-counter medicines, prescription medicines, and herbal remedies. °· You have a medical condition or problem you have not already discussed. °Your health care provider will also consider: °· How often the medicine has to be taken. °· Common side effects of the medicine. °· The cost of the medicine. °· The taste of the medicine. °If you have questions about why an antibiotic was chosen, make sure to ask. °FOR HOW LONG SHOULD I TAKE MY ANTIBIOTIC? °Continue to take your antibiotic for as long as told by your health care provider. Do not stop taking it when you feel better. If you stop taking it too soon: °· You may start to feel sick again. °· Your infection may become harder to treat. °· Complications may develop. °WHAT IF I MISS A DOSE? °Try not to miss any doses of medicine. If you miss a dose, take it as soon as possible. However, if it is almost time for the next dose: °· If you are taking 2 doses per day, take the missed dose and the next dose 5 to 6 hours apart. °· If you are taking 3 or more doses per day, take the missed dose and the next dose 2 to 4 hours apart, then go back to the normal schedule. °If you cannot make up a missed dose, take the next scheduled dose on time. Then take the missed dose after you have taken all the doses as recommended by your health care provider, as if you had one more dose left. °DO ANTIBIOTICS AFFECT BIRTH CONTROL? °Birth control pills may not work while you are on antibiotics. If you are taking birth  control pills, continue taking them as usual and use a second form of birth control, such as a condom, to avoid unwanted pregnancy. Continue using the second form of birth control until you are finished with your current 1 month cycle of birth control pills. °OTHER INFORMATION °· If there is any medicine left over, throw it away. °· Never take someone else's antibiotics. °· Never take leftover antibiotics. °SEEK MEDICAL CARE IF: °· You get worse. °· You do not feel better within a few days of starting the antibiotic medicine. °· You vomit. °· White patches appear in your mouth. °· You have new joint pain that begins after starting the antibiotic. °· You have new muscle aches that begin after starting the antibiotic. °· You had a fever before starting the antibiotic and it returns. °· You have any symptoms of an allergic reaction, such as an itchy rash. If this happens, stop taking the antibiotic. °SEEK IMMEDIATE MEDICAL CARE IF: °· Your urine turns dark or becomes blood-colored. °· Your skin turns yellow. °· You bruise or bleed easily. °· You have severe diarrhea and abdominal cramps. °· You have a severe headache. °· You have signs of a severe allergic reaction, such as: °¨ Trouble breathing. °¨ Wheezing. °¨ Swelling of the lips, tongue, or face. °¨ Fainting. °¨ Blisters on the skin or in the mouth. °If you have signs of a severe allergic   reaction, stop taking the antibiotic right away. °  °This information is not intended to replace advice given to you by your health care provider. Make sure you discuss any questions you have with your health care provider. °  °Document Released: 08/29/2004 Document Revised: 09/07/2015 Document Reviewed: 05/04/2015 °Elsevier Interactive Patient Education ©2016 [employer] ° °Urinary Tract Infection °Urinary tract infections (UTIs) can develop anywhere along your urinary tract. Your urinary tract is your body's drainage system for removing wastes and extra water. Your urinary  tract includes two kidneys, two ureters, a bladder, and a urethra. Your kidneys are a pair of bean-shaped organs. Each kidney is about the size of your fist. They are located below your ribs, one on each side of your spine. °CAUSES °Infections are caused by microbes, which are microscopic organisms, including fungi, viruses, and bacteria. These organisms are so small that they can only be seen through a microscope. Bacteria are the microbes that most commonly cause UTIs. °SYMPTOMS  °Symptoms of UTIs may vary by age and gender of the patient and by the location of the infection. Symptoms in young women typically include a frequent and intense urge to urinate and a painful, burning feeling in the bladder or urethra during urination. Older women and men are more likely to be tired, shaky, and weak and have muscle aches and abdominal pain. A fever may mean the infection is in your kidneys. Other symptoms of a kidney infection include pain in your back or sides below the ribs, nausea, and vomiting. °DIAGNOSIS °To diagnose a UTI, your caregiver will ask you about your symptoms. Your caregiver will also ask you to provide a urine sample. The urine sample will be tested for bacteria and white blood cells. White blood cells are made by your body to help fight infection. °TREATMENT  °Typically, UTIs can be treated with medication. Because most UTIs are caused by a bacterial infection, they usually can be treated with the use of antibiotics. The choice of antibiotic and length of treatment depend on your symptoms and the type of bacteria causing your infection. °HOME CARE INSTRUCTIONS °· If you were prescribed antibiotics, take them exactly as your caregiver instructs you. Finish the medication even if you feel better after you have only taken some of the medication. °· Drink enough water and fluids to keep your urine clear or pale yellow. °· Avoid caffeine, tea, and carbonated beverages. They tend to irritate your  bladder. °· Empty your bladder often. Avoid holding urine for long periods of time. °· Empty your bladder before and after sexual intercourse. °· After a bowel movement, women should cleanse from front to back. Use each tissue only once. °SEEK MEDICAL CARE IF:  °· You have back pain. °· You develop a fever. °· Your symptoms do not begin to resolve within 3 days. °SEEK IMMEDIATE MEDICAL CARE IF:  °· You have severe back pain or lower abdominal pain. °· You develop chills. °· You have nausea or vomiting. °· You have continued burning or discomfort with urination. °MAKE SURE YOU:  °· Understand these instructions. °· Will watch your condition. °· Will get help right away if you are not doing well or get worse. °  °This information is not intended to replace advice given to you by your health care provider. Make sure you discuss any questions you have with your health care provider. °  °Document Released: 09/26/2005 Document Revised: 09/07/2015 Document Reviewed: 01/25/2012 °Elsevier Interactive Patient Education ©2016 Elsevier Inc. ° °

## 2015-10-30 LAB — URINE CULTURE: Culture: >=100000

## 2015-10-31 ENCOUNTER — Telehealth (HOSPITAL_COMMUNITY): Payer: Self-pay

## 2015-10-31 NOTE — Progress Notes (Signed)
ED Antimicrobial Stewardship Positive Culture Follow Up   [name] is an 34 y.o. female who [facility] with a chief complaint of  Chief Complaint  Patient presents with  . Flank Pain  . Urinary Tract Infection    Recent Results (from the past 720 hour(s))  Urine culture     Status: None   Collection Time: 10/28/15  8:05 PM  Result Value Ref Range Status   Specimen Description URINE, CLEAN CATCH  Final   Special Requests NONE  Final   Culture   Final    >=100,000 COLONIES/mL ESCHERICHIA COLI Per[facility] at [facility] [place]    Report Status 10/30/2015 FINAL  Final   Organism ID, Bacteria ESCHERICHIA COLI  Final      Susceptibility   Escherichia coli - MIC*    AMPICILLIN >=32 RESISTANT Resistant     CEFAZOLIN >=64 RESISTANT Resistant     CEFTRIAXONE <=1 SENSITIVE Sensitive     CIPROFLOXACIN <=0.25 SENSITIVE Sensitive     GENTAMICIN <=1 SENSITIVE Sensitive     IMIPENEM <=0.25 SENSITIVE Sensitive     NITROFURANTOIN 32 SENSITIVE Sensitive     TRIMETH/SULFA <=20 SENSITIVE Sensitive     AMPICILLIN/SULBACTAM >=32 RESISTANT Resistant     PIP/TAZO 64 INTERMEDIATE Intermediate     * >=100,000 COLONIES/mL ESCHERICHIA COLI    [x]  Treated with Cephalexin, organism resistant to prescribed antimicrobial []  Patient discharged originally without antimicrobial agent and treatment is now indicated  New antibiotic prescription: Cipro 500mg  PO BID x 10 days Patient should stop Cephalexin.  ED Provider: Dierdre ForthHannah Muthersbaugh, PA-C   Sallee Provencalurner, Dohn Stclair S 10/31/2015, 9:31 AM Infectious Diseases Pharmacist Phone# [phone]

## 2015-10-31 NOTE — Telephone Encounter (Signed)
Post ED Visit - Positive Culture Follow-up: Chart Hand-off to ED Flow Manager  Culture assessed and recommendations reviewed by: []  Isaac BlissMichael Maccia, Pharm.D., BCPS []  Celedonio MiyamotoJeremy Frens, Pharm.D., BCPS-AQ ID []  Georgina PillionElizabeth Martin, Pharm.D., BCPS []  MiltonMinh Pham, [address]Pharm[address]D., BCPS, AAHIVP [x]  Estella HuskMichelle Turner, Pharm .D., BCPS, AAHIVP []  Tennis Mustassie Stewart, [address]Ph[address] []  Casilda Carlsaylor Stone, Pharm.D.  Positive urine culture  []  Patient discharged without antimicrobial prescription and treatment is now indicated [x]  Organism is resistant to prescribed ED discharge antimicrobial []  Patient with positive blood cultures  Changes discussed with ED provider: Dierdre ForthHannah Muthersbaugh New antibiotic prescription stop keflex and start cipro 500mg  po bid x 10days  Attempted call. Phone has calling restrictions. Letter sent to address on file.    Ashley JacobsFesterman, Mansur Patti C 10/31/2015, 10:44 AM

## 2015-11-01 ENCOUNTER — Emergency Department (HOSPITAL_COMMUNITY)
Admission: EM | Admit: 2015-11-01 | Discharge: 2015-11-02 | Disposition: A | Discharge status: Home / Self Care | Payer: Self-pay | Attending: Emergency Medicine | Admitting: Emergency Medicine

## 2015-11-01 ENCOUNTER — Encounter (HOSPITAL_COMMUNITY): Payer: Self-pay | Admitting: *Deleted

## 2015-11-01 DIAGNOSIS — Z8659 Personal history of other mental and behavioral disorders: Secondary | ICD-10-CM | POA: Insufficient documentation

## 2015-11-01 DIAGNOSIS — Z8739 Personal history of other diseases of the musculoskeletal system and connective tissue: Secondary | ICD-10-CM | POA: Insufficient documentation

## 2015-11-01 DIAGNOSIS — Z3202 Encounter for pregnancy test, result negative: Secondary | ICD-10-CM | POA: Insufficient documentation

## 2015-11-01 DIAGNOSIS — Z87440 Personal history of urinary (tract) infections: Secondary | ICD-10-CM | POA: Insufficient documentation

## 2015-11-01 DIAGNOSIS — Z791 Long term (current) use of non-steroidal anti-inflammatories (NSAID): Secondary | ICD-10-CM | POA: Insufficient documentation

## 2015-11-01 DIAGNOSIS — Z87828 Personal history of other (healed) physical injury and trauma: Secondary | ICD-10-CM | POA: Insufficient documentation

## 2015-11-01 DIAGNOSIS — N12 Tubulo-interstitial nephritis, not specified as acute or chronic: Secondary | ICD-10-CM | POA: Insufficient documentation

## 2015-11-01 DIAGNOSIS — Z720 Tobacco use: Secondary | ICD-10-CM | POA: Insufficient documentation

## 2015-11-01 LAB — URINE MICROSCOPIC-ADD ON

## 2015-11-01 LAB — COMPREHENSIVE METABOLIC PANEL
ALT: 34 U/L (ref 14–54)
AST: 21 U/L (ref 15–41)
Albumin: 3.2 g/dL — ABNORMAL LOW (ref 3.5–5.0)
Alkaline Phosphatase: 98 U/L (ref 38–126)
Anion gap: 10 (ref 5–15)
BUN: 8 mg/dL (ref 6–20)
CO2: 25 mmol/L (ref 22–32)
Calcium: 9.2 mg/dL (ref 8.9–10.3)
Chloride: 100 mmol/L — ABNORMAL LOW (ref 101–111)
Creatinine, Ser: 0.72 mg/dL (ref 0.44–1.00)
GFR calc Af Amer: >60 mL/min (ref >60)
GFR calc non Af Amer: >60 mL/min (ref >60)
Glucose, Bld: 172 mg/dL — ABNORMAL HIGH (ref 65–99)
Potassium: 3 mmol/L — ABNORMAL LOW (ref 3.5–5.1)
Sodium: 135 mmol/L (ref 135–145)
Total Bilirubin: 1 mg/dL (ref 0.3–1.2)
Total Protein: 6.3 g/dL — ABNORMAL LOW (ref 6.5–8.1)

## 2015-11-01 LAB — URINALYSIS, ROUTINE W REFLEX MICROSCOPIC
Bilirubin Urine: NEGATIVE
Glucose, UA: NEGATIVE mg/dL
Ketones, ur: NEGATIVE mg/dL
Nitrite: POSITIVE — AB
Protein, ur: NEGATIVE mg/dL
Specific Gravity, Urine: 1.009 (ref 1.005–1.030)
Urobilinogen, UA: 0.2 mg/dL (ref 0.0–1.0)
pH: 6 (ref 5.0–8.0)

## 2015-11-01 LAB — CBC
HCT: 39.3 % (ref 36.0–46.0)
Hemoglobin: 13.1 g/dL (ref 12.0–15.0)
MCH: 29.9 pg (ref 26.0–34.0)
MCHC: 33.3 g/dL (ref 30.0–36.0)
MCV: 89.7 fL (ref 78.0–100.0)
Platelets: 166 10*3/uL (ref 150–400)
RBC: 4.38 MIL/uL (ref 3.87–5.11)
RDW: 13.2 % (ref 11.5–15.5)
WBC: 8 10*3/uL (ref 4.0–10.5)

## 2015-11-01 LAB — PREGNANCY, URINE: PREG TEST UR: NEGATIVE

## 2015-11-01 LAB — LIPASE, BLOOD: Lipase: 17 U/L (ref 11–51)

## 2015-11-01 MED ORDER — DEXTROSE 5 % IV SOLN
1.0000 g | Freq: Once | INTRAVENOUS | Status: AC
  Administered 2015-11-01: 1 g via INTRAVENOUS
  Filled 2015-11-01: qty 10

## 2015-11-01 MED ORDER — OXYCODONE-ACETAMINOPHEN 5-325 MG PO TABS
2.0000 | ORAL_TABLET | Freq: Once | ORAL | Status: AC
  Administered 2015-11-01: 2 via ORAL
  Filled 2015-11-01: qty 2

## 2015-11-01 MED ORDER — POTASSIUM CHLORIDE CRYS ER 20 MEQ PO TBCR
40.0000 meq | EXTENDED_RELEASE_TABLET | Freq: Once | ORAL | Status: AC
  Administered 2015-11-01: 40 meq via ORAL
  Filled 2015-11-01: qty 2

## 2015-11-01 MED ORDER — SODIUM CHLORIDE 0.9 % IV BOLUS (SEPSIS)
1000.0000 mL | Freq: Once | INTRAVENOUS | Status: AC
  Administered 2015-11-01: 1000 mL via INTRAVENOUS

## 2015-11-01 NOTE — ED Notes (Signed)
Ben, PA-C, at the bedside.  
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
## 2015-11-01 NOTE — ED Notes (Signed)
Pt states that she has had abd pain since Friday. [facility] told her to take Aleve. States that her fever has increased and she is still having abd pain.  No vomiting or diarrhea.

## 2015-11-01 NOTE — ED Notes (Signed)
Called lab, spoke to [place] to add on a pregnancy, urine. Lab acknowledged.

## 2015-11-02 MED ORDER — CIPROFLOXACIN HCL 500 MG PO TABS: 500.0000 mg | ORAL_TABLET | Freq: Two times a day (BID) | ORAL | Status: DC

## 2015-11-02 MED ORDER — HYDROCODONE-ACETAMINOPHEN 5-325 MG PO TABS: 2.0000 | ORAL_TABLET | ORAL | Status: DC | PRN

## 2015-11-02 NOTE — ED Notes (Signed)
Talked with Lana in Main Lab to add on urine culture to urinalysis they already have. Main lab acknowledges.

## 2015-11-02 NOTE — Discharge Instructions (Signed)
You were evaluated in the ED today and your symptoms are likely due to a kidney infection. You were treated for this with antibiotics in the ED. You will also be given antibiotics for home. Please continue to take Tylenol and Motrin as needed for your pain. Usual pain medicine only for severe pain and do not take this medicine while driving or operating machinery. Follow up with your doctor in 3 days for reevaluation. Return to ED for worsening symptoms.  Pyelonephritis, Adult Pyelonephritis is a kidney infection. The kidneys are the organs that filter a person's blood and move waste out of the bloodstream and into the urine. Urine passes from the kidneys, through the ureters, and into the bladder. There are two main types of pyelonephritis:  Infections that come on quickly without any warning (acute pyelonephritis).  Infections that last for a long period of time (chronic pyelonephritis). In most cases, the infection clears up with treatment and does not cause further problems. More severe infections or chronic infections can sometimes spread to the bloodstream or lead to other problems with the kidneys. CAUSES This condition is usually caused by:  Bacteria traveling from the bladder to the kidney through infected urine. The urine in the bladder can become infected with bacteria from:  Bladder infection (cystitis).  Inflammation of the prostate gland (prostatitis).  Sexual intercourse, in females.  Bacteria traveling from the bloodstream to the kidney. RISK FACTORS This condition is more likely to develop in:  Pregnant women.  Older people.  People who have diabetes.  People who have kidney stones or bladder stones.  People who have other abnormalities of the kidney or ureter.  People who have a catheter placed in the bladder.  People who have cancer.  People who are sexually active.  Women who use spermicides.  People who have had a prior urinary tract  infection. SYMPTOMS Symptoms of this condition include:  Frequent urination.  Strong or persistent urge to urinate.  Burning or stinging when urinating.  Abdominal pain.  Back pain.  Pain in the side or flank area.  Fever.  Chills.  Blood in the urine, or dark urine.  Nausea.  Vomiting. DIAGNOSIS This condition may be diagnosed based on:  Medical history and physical exam.  Urine tests.  Blood tests. You may also have imaging tests of the kidneys, such as an ultrasound or CT scan. TREATMENT Treatment for this condition may depend on the severity of the infection.  If the infection is mild and is found early, you may be treated with antibiotic medicines taken by mouth. You will need to drink fluids to remain hydrated.  If the infection is more severe, you may need to stay in the hospital and receive antibiotics given directly into a vein through an IV tube. You may also need to receive fluids through an IV tube if you are not able to remain hydrated. After your hospital stay, you may need to take oral antibiotics for a period of time. Other treatments may be required, depending on the cause of the infection. HOME CARE INSTRUCTIONS Medicines  Take over-the-counter and prescription medicines only as told by your health care provider.  If you were prescribed an antibiotic medicine, take it as told by your health care provider. Do not stop taking the antibiotic even if you start to feel better. General Instructions  Drink enough fluid to keep your urine clear or pale yellow.  Avoid caffeine, tea, and carbonated beverages. They tend to irritate the bladder.  Urinate often. Avoid holding in urine for long periods of time.  Urinate before and after sex.  After a bowel movement, women should cleanse from front to back. Use each tissue only once.  Keep all follow-up visits as told by your health care provider. This is important. SEEK MEDICAL CARE IF:  Your symptoms  do not get better after 2 days of treatment.  Your symptoms get worse.  You have a fever. SEEK IMMEDIATE MEDICAL CARE IF:  You are unable to take your antibiotics or fluids.  You have shaking chills.  You vomit.  You have severe flank or back pain.  You have extreme weakness or fainting.   This information is not intended to replace advice given to you by your health care provider. Make sure you discuss any questions you have with your health care provider.   Document Released: 12/17/2005 Document Revised: 09/07/2015 Document Reviewed: 04/11/2015 Elsevier Interactive Patient Education [employer].

## 2015-11-04 LAB — URINE CULTURE: Culture: >=100000

## 2015-11-06 ENCOUNTER — Telehealth (HOSPITAL_COMMUNITY): Payer: Self-pay

## 2015-11-06 NOTE — Telephone Encounter (Signed)
Post ED Visit - Positive Culture Follow-up  Culture report reviewed by antimicrobial stewardship pharmacist:  []  Enzo BiNathan Batchelder, Pharm.D. []  Celedonio MiyamotoJeremy Frens, Pharm.D., BCPS []  Garvin FilaMike Maccia, Pharm.D. [x]  Georgina PillionElizabeth Martin, Pharm.D., BCPS []  EdesvilleMinh Pham, [address]Pharm.D., BCPS, AAHIVP []  Estella HuskMichelle Turner, Pharm.D., BCPS, AAHIVP []  Tennis Mustassie Stewart, Pharm.D. []  Sherle Poeob Vincent, [address]Pharm[address]  Positive urine culture, >/= 100,000 colonies -> E Coli Treated with Ciprofloxacin, organism sensitive to the same and no further patient follow-up is required at this time.  Arvid RightClark, Shannen Vernon Dorn 11/06/2015, 3:01 AM

## 2015-11-23 ENCOUNTER — Telehealth (HOSPITAL_COMMUNITY): Payer: Self-pay

## 2015-11-23 NOTE — Telephone Encounter (Signed)
Pt returned 11/02/2015 and was sent home w/Rx for cipro.  Chart closed

## 2016-05-02 ENCOUNTER — Encounter (HOSPITAL_COMMUNITY): Payer: Self-pay | Admitting: Emergency Medicine

## 2016-05-02 ENCOUNTER — Emergency Department (HOSPITAL_COMMUNITY)
Admission: EM | Admit: 2016-05-02 | Discharge: 2016-05-02 | Disposition: A | Discharge status: Home / Self Care | Payer: No Typology Code available for payment source | Attending: Emergency Medicine | Admitting: Emergency Medicine

## 2016-05-02 DIAGNOSIS — Y9389 Activity, other specified: Secondary | ICD-10-CM | POA: Insufficient documentation

## 2016-05-02 DIAGNOSIS — Z8739 Personal history of other diseases of the musculoskeletal system and connective tissue: Secondary | ICD-10-CM | POA: Insufficient documentation

## 2016-05-02 DIAGNOSIS — J45909 Unspecified asthma, uncomplicated: Secondary | ICD-10-CM | POA: Insufficient documentation

## 2016-05-02 DIAGNOSIS — W57XXXA Bitten or stung by nonvenomous insect and other nonvenomous arthropods, initial encounter: Secondary | ICD-10-CM | POA: Insufficient documentation

## 2016-05-02 DIAGNOSIS — Z79899 Other long term (current) drug therapy: Secondary | ICD-10-CM | POA: Insufficient documentation

## 2016-05-02 DIAGNOSIS — Z90722 Acquired absence of ovaries, bilateral: Secondary | ICD-10-CM | POA: Insufficient documentation

## 2016-05-02 DIAGNOSIS — Z8659 Personal history of other mental and behavioral disorders: Secondary | ICD-10-CM | POA: Insufficient documentation

## 2016-05-02 DIAGNOSIS — Y998 Other external cause status: Secondary | ICD-10-CM | POA: Insufficient documentation

## 2016-05-02 DIAGNOSIS — Z791 Long term (current) use of non-steroidal anti-inflammatories (NSAID): Secondary | ICD-10-CM | POA: Insufficient documentation

## 2016-05-02 DIAGNOSIS — S80862A Insect bite (nonvenomous), left lower leg, initial encounter: Secondary | ICD-10-CM | POA: Insufficient documentation

## 2016-05-02 DIAGNOSIS — Y9289 Other specified places as the place of occurrence of the external cause: Secondary | ICD-10-CM | POA: Insufficient documentation

## 2016-05-02 DIAGNOSIS — F17210 Nicotine dependence, cigarettes, uncomplicated: Secondary | ICD-10-CM | POA: Insufficient documentation

## 2016-05-02 MED ORDER — CEPHALEXIN 500 MG PO CAPS: 500.0000 mg | ORAL_CAPSULE | Freq: Four times a day (QID) | ORAL | Status: DC

## 2016-05-02 MED ORDER — ALBUTEROL SULFATE HFA 108 (90 BASE) MCG/ACT IN AERS
2.0000 | INHALATION_SPRAY | Freq: Once | RESPIRATORY_TRACT | Status: AC
  Administered 2016-05-02: 2 via RESPIRATORY_TRACT
  Filled 2016-05-02: qty 6.7

## 2016-05-02 NOTE — ED Notes (Signed)
Pt sts has possible insect bite to left lower leg that is red and warm to touch

## 2016-05-02 NOTE — ED Notes (Signed)
Declined W/C at D/C and was escorted to lobby by RN. 
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
## 2016-05-02 NOTE — Discharge Instructions (Signed)
1. Medications: keflex, benadryl, usual home medications 2. Treatment: rest, drink plenty of fluids 3. Follow Up: please followup with your primary doctor in 2-3 days for discussion of your diagnoses and further evaluation after today's visit; if you do not have a primary care doctor use the phone number listed in your discharge paperwork to find one; please return to the ER for high fever, vomiting, increased pain/redness/swelling, new or worsening symptoms

## 2016-11-18 IMAGING — CR DG ABDOMEN 1V
2 series · 2 of 2 positions shown · non-contrast
Comparison: May 13, 2011

CLINICAL DATA: One day history of right flank pain

EXAM:
ABDOMEN - 1 VIEW

[t abdomen supine (1 of 2)]
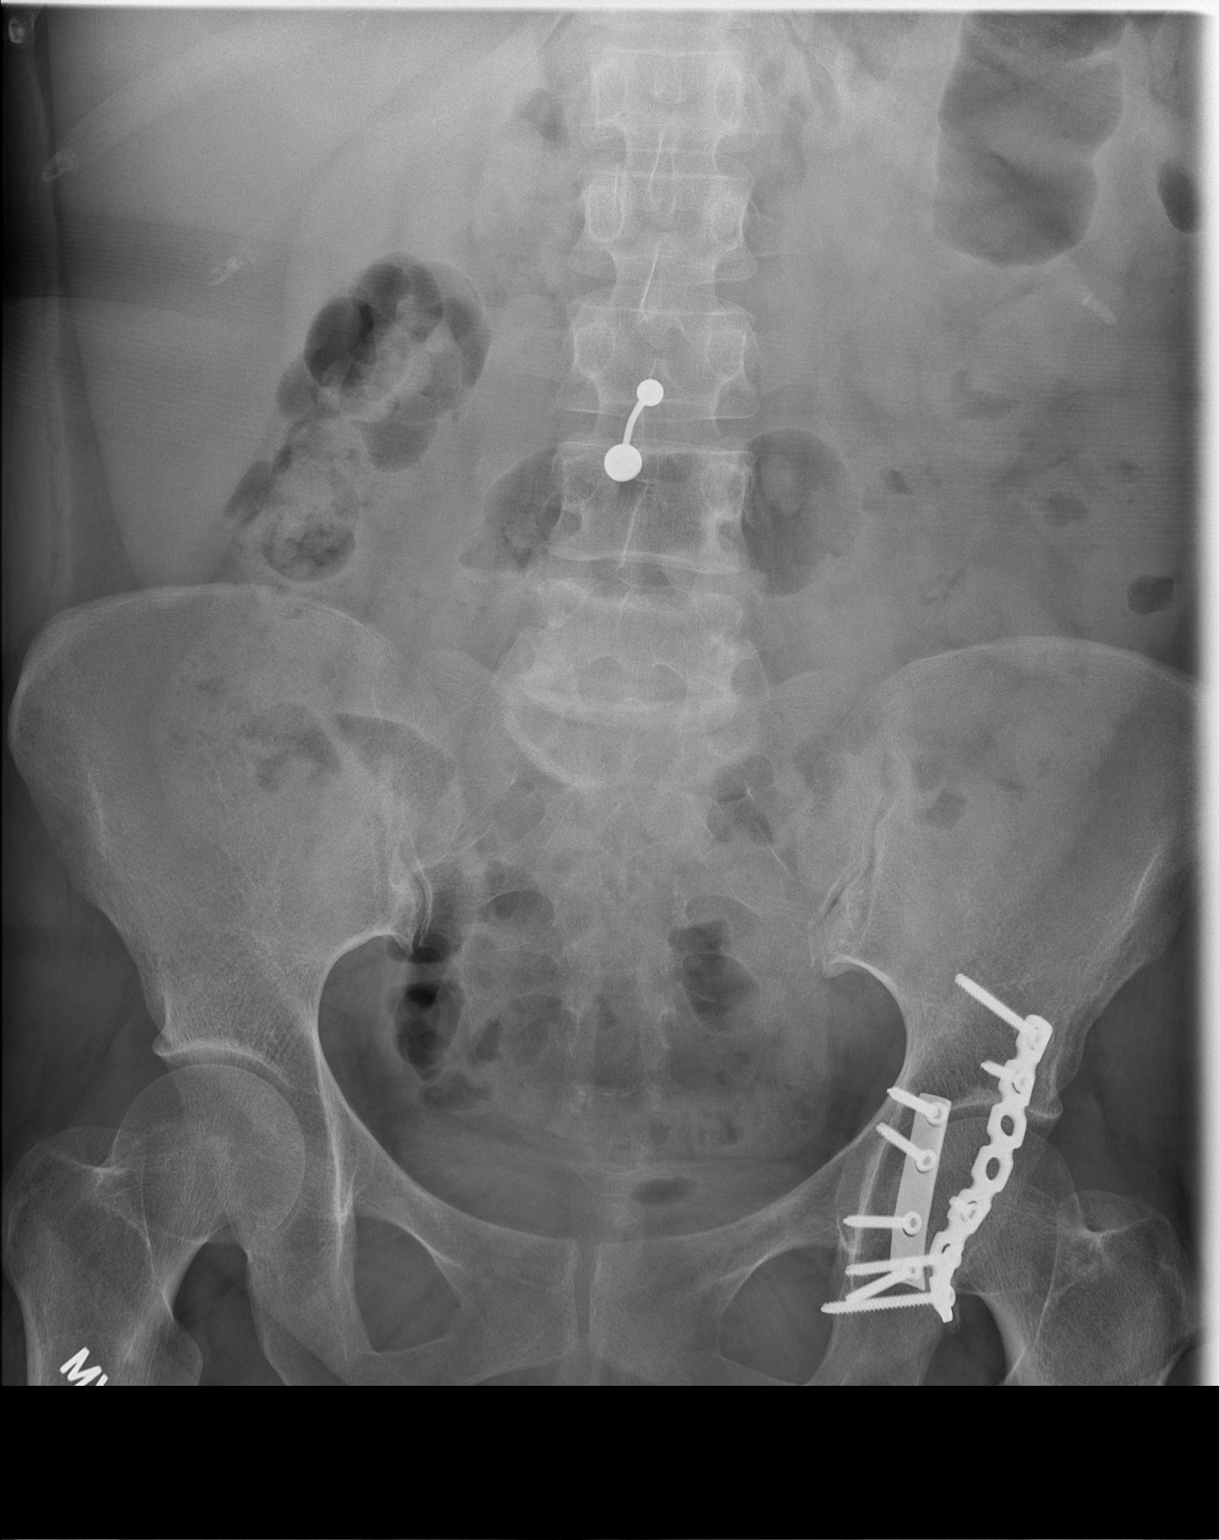

[t abdomen supine (2 of 2)]
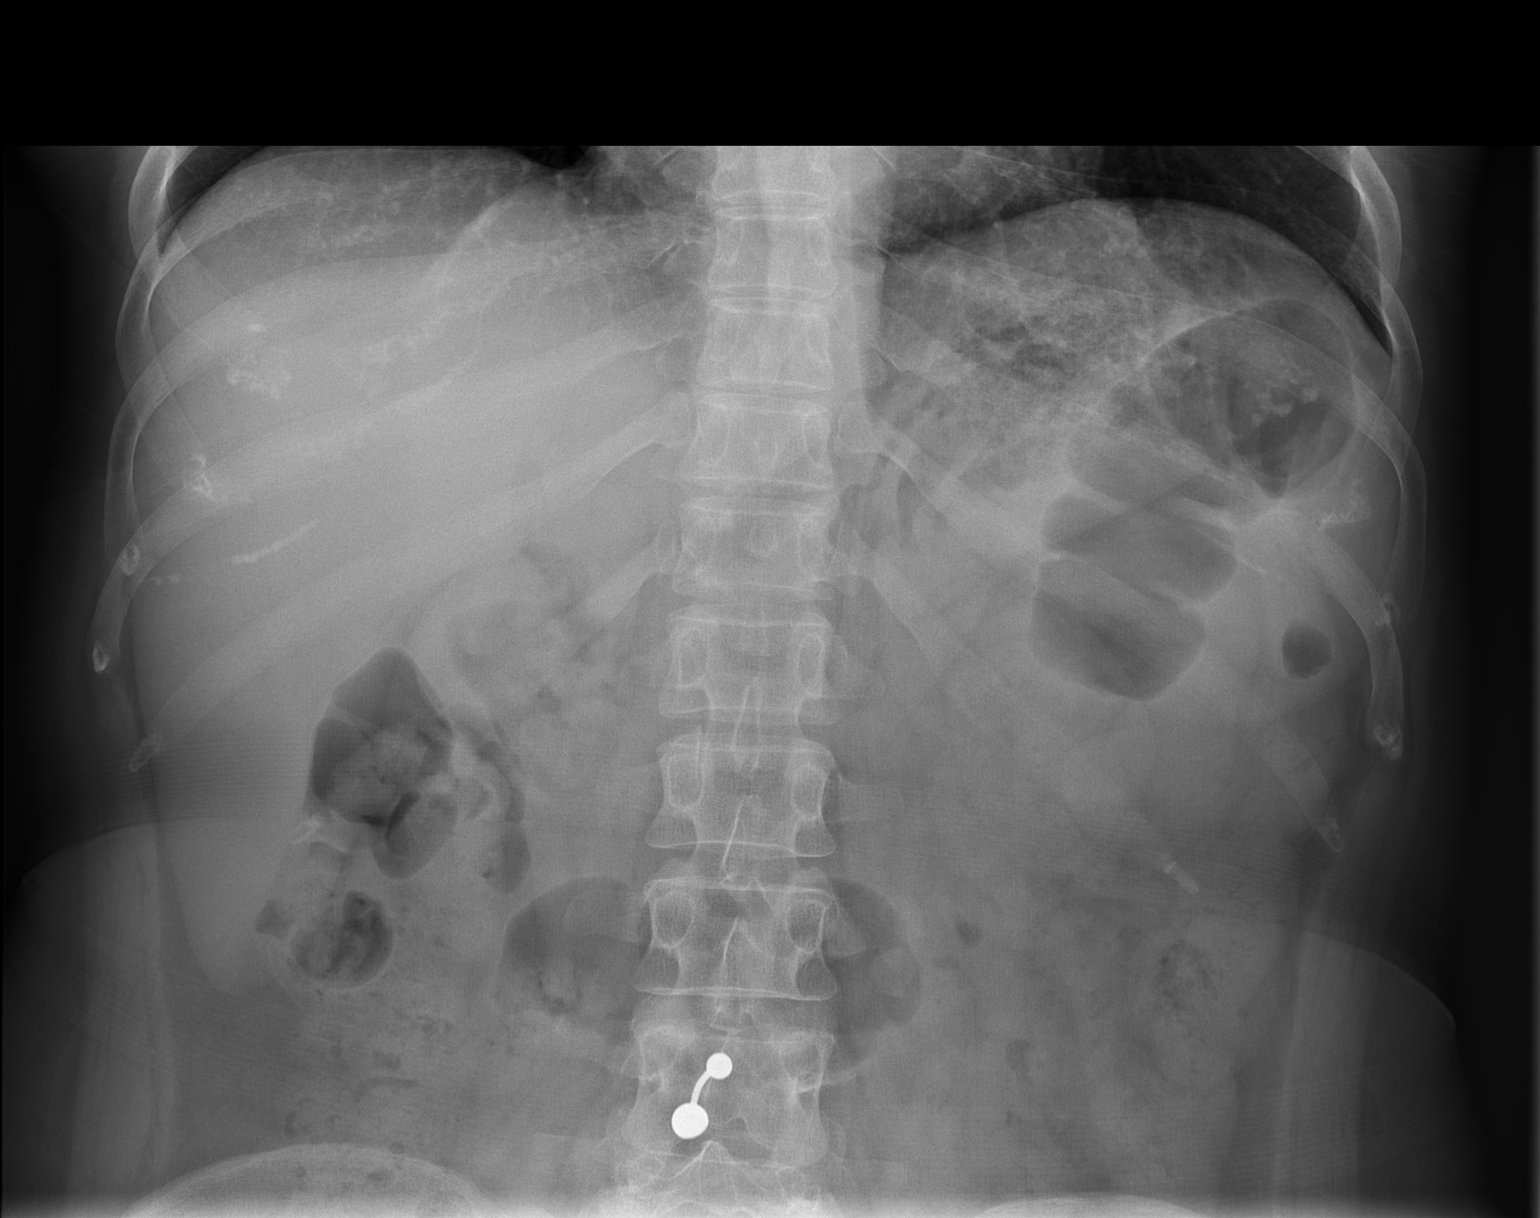

[2 of 2 positions shown; findings below may reference images not displayed]

FINDINGS: No abnormal calcifications are identified. There is moderate stool
throughout colon. No bowel dilatation or air-fluid level suggesting
obstruction. No free air. There is postoperative change in the left
acetabular region.
IMPRESSION: Bowel gas pattern unremarkable. No abnormal calcifications.
Postoperative change left acetabulum.

## 2017-09-08 ENCOUNTER — Emergency Department (HOSPITAL_COMMUNITY)
Admission: EM | Admit: 2017-09-08 | Discharge: 2017-09-08 | Disposition: A | Discharge status: Home / Self Care | Payer: Self-pay | Attending: Emergency Medicine | Admitting: Emergency Medicine

## 2017-09-08 ENCOUNTER — Emergency Department (HOSPITAL_COMMUNITY): Payer: Self-pay

## 2017-09-08 ENCOUNTER — Encounter (HOSPITAL_COMMUNITY): Payer: Self-pay | Admitting: Emergency Medicine

## 2017-09-08 DIAGNOSIS — F17210 Nicotine dependence, cigarettes, uncomplicated: Secondary | ICD-10-CM | POA: Insufficient documentation

## 2017-09-08 DIAGNOSIS — R091 Pleurisy: Secondary | ICD-10-CM | POA: Insufficient documentation

## 2017-09-08 DIAGNOSIS — R079 Chest pain, unspecified: Secondary | ICD-10-CM | POA: Insufficient documentation

## 2017-09-08 LAB — BASIC METABOLIC PANEL
ANION GAP: 11 (ref 5–15)
BUN: 16 mg/dL (ref 6–20)
CO2: 21 mmol/L — ABNORMAL LOW (ref 22–32)
Calcium: 8.8 mg/dL — ABNORMAL LOW (ref 8.9–10.3)
Chloride: 105 mmol/L (ref 101–111)
Creatinine, Ser: 0.89 mg/dL (ref 0.44–1.00)
GFR calc Af Amer: >60 mL/min (ref >60)
Glucose, Bld: 111 mg/dL — ABNORMAL HIGH (ref 65–99)
POTASSIUM: 3.4 mmol/L — AB (ref 3.5–5.1)
SODIUM: 137 mmol/L (ref 135–145)

## 2017-09-08 LAB — CBC
HEMATOCRIT: 41.5 % (ref 36.0–46.0)
HEMOGLOBIN: 13.8 g/dL (ref 12.0–15.0)
MCH: 30.7 pg (ref 26.0–34.0)
MCHC: 33.3 g/dL (ref 30.0–36.0)
MCV: 92.2 fL (ref 78.0–100.0)
Platelets: 198 10*3/uL (ref 150–400)
RBC: 4.5 MIL/uL (ref 3.87–5.11)
RDW: 13 % (ref 11.5–15.5)
WBC: 9.1 10*3/uL (ref 4.0–10.5)

## 2017-09-08 LAB — I-STAT TROPONIN, ED: Troponin i, poc: 0 ng/mL (ref 0.00–0.08)

## 2017-09-08 LAB — D-DIMER, QUANTITATIVE (NOT AT ARMC): D DIMER QUANT: 0.28 ug{FEU}/mL (ref 0.00–0.50)

## 2017-09-08 MED ORDER — ALBUTEROL SULFATE (2.5 MG/3ML) 0.083% IN NEBU
INHALATION_SOLUTION | RESPIRATORY_TRACT | Status: AC
  Filled 2017-09-08: qty 6

## 2017-09-08 MED ORDER — ALBUTEROL SULFATE (2.5 MG/3ML) 0.083% IN NEBU
5.0000 mg | INHALATION_SOLUTION | Freq: Once | RESPIRATORY_TRACT | Status: AC
  Administered 2017-09-08: 5 mg via RESPIRATORY_TRACT

## 2017-09-08 MED ORDER — NAPROXEN 500 MG PO TABS: 500.0000 mg | ORAL_TABLET | Freq: Two times a day (BID) | ORAL | 0 refills | Status: DC

## 2017-09-08 MED ORDER — ALBUTEROL SULFATE HFA 108 (90 BASE) MCG/ACT IN AERS
2.0000 | INHALATION_SPRAY | RESPIRATORY_TRACT | Status: DC
  Administered 2017-09-08: 2 via RESPIRATORY_TRACT
  Filled 2017-09-08: qty 6.7

## 2017-09-08 MED ORDER — KETOROLAC TROMETHAMINE 60 MG/2ML IM SOLN
60.0000 mg | Freq: Once | INTRAMUSCULAR | Status: AC
  Administered 2017-09-08: 60 mg via INTRAMUSCULAR
  Filled 2017-09-08: qty 2

## 2017-09-08 NOTE — ED Notes (Signed)
ED Provider at bedside. 
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
## 2017-09-08 NOTE — ED Triage Notes (Signed)
Pt c/o 8/10 bilateral sharp pain and Sob for the past 2 days getting worse today. No fever or chills, no nausea or vomiting.

## 2018-10-21 ENCOUNTER — Other Ambulatory Visit: Payer: Self-pay

## 2018-10-21 ENCOUNTER — Emergency Department (HOSPITAL_COMMUNITY)
Admission: EM | Admit: 2018-10-21 | Discharge: 2018-10-21 | Disposition: A | Discharge status: Home / Self Care | Payer: Self-pay | Attending: Emergency Medicine | Admitting: Emergency Medicine

## 2018-10-21 ENCOUNTER — Emergency Department (HOSPITAL_COMMUNITY): Payer: Self-pay

## 2018-10-21 ENCOUNTER — Encounter (HOSPITAL_COMMUNITY): Payer: Self-pay | Admitting: Emergency Medicine

## 2018-10-21 DIAGNOSIS — Q529 Congenital malformation of female genitalia, unspecified: Secondary | ICD-10-CM | POA: Insufficient documentation

## 2018-10-21 DIAGNOSIS — I491 Atrial premature depolarization: Secondary | ICD-10-CM | POA: Insufficient documentation

## 2018-10-21 DIAGNOSIS — F17210 Nicotine dependence, cigarettes, uncomplicated: Secondary | ICD-10-CM | POA: Insufficient documentation

## 2018-10-21 LAB — BASIC METABOLIC PANEL
ANION GAP: 9 (ref 5–15)
BUN: 20 mg/dL (ref 6–20)
CALCIUM: 9.3 mg/dL (ref 8.9–10.3)
CO2: 21 mmol/L — ABNORMAL LOW (ref 22–32)
Chloride: 109 mmol/L (ref 98–111)
Creatinine, Ser: 0.99 mg/dL (ref 0.44–1.00)
Glucose, Bld: 132 mg/dL — ABNORMAL HIGH (ref 70–99)
Potassium: 3.9 mmol/L (ref 3.5–5.1)
SODIUM: 139 mmol/L (ref 135–145)

## 2018-10-21 LAB — CBC
HCT: 45.6 % (ref 36.0–46.0)
Hemoglobin: 14.6 g/dL (ref 12.0–15.0)
MCH: 30.1 pg (ref 26.0–34.0)
MCHC: 32 g/dL (ref 30.0–36.0)
MCV: 94 fL (ref 80.0–100.0)
NRBC: 0 % (ref 0.0–0.2)
Platelets: 289 10*3/uL (ref 150–400)
RBC: 4.85 MIL/uL (ref 3.87–5.11)
RDW: 12.4 % (ref 11.5–15.5)
WBC: 8.6 10*3/uL (ref 4.0–10.5)

## 2018-10-21 LAB — I-STAT BETA HCG BLOOD, ED (MC, WL, AP ONLY): I-stat hCG, quantitative: <5 m[IU]/mL (ref <5)

## 2018-10-21 LAB — I-STAT TROPONIN, ED: TROPONIN I, POC: 0 ng/mL (ref 0.00–0.08)

## 2018-10-21 MED ORDER — PROPRANOLOL HCL 10 MG PO TABS: 10.0000 mg | ORAL_TABLET | Freq: Two times a day (BID) | ORAL | 0 refills | Status: DC

## 2018-10-21 MED ORDER — VARENICLINE TARTRATE 0.5 MG X 11 & 1 MG X 42 PO MISC: ORAL | 0 refills | Status: DC

## 2018-10-21 NOTE — ED Notes (Signed)
Patient's reports feeling palpitation every 4 minutes but goes away each time she takes a deep breathe.

## 2018-10-21 NOTE — ED Triage Notes (Signed)
Pt reports her heart is skipping a beat, "held my pulse on my neck for an hour and sometimes it skips so I hit my self on the chest and it stops."  Pt denies any chest pain or sob.  Pt states this has been going on for two weeks.  Pt reports "a lot going on in my life right now."

## 2018-10-23 ENCOUNTER — Other Ambulatory Visit: Payer: Self-pay

## 2018-10-23 ENCOUNTER — Encounter (HOSPITAL_COMMUNITY): Payer: Self-pay

## 2018-10-23 ENCOUNTER — Emergency Department (HOSPITAL_COMMUNITY): Payer: Self-pay

## 2018-10-23 ENCOUNTER — Emergency Department (HOSPITAL_COMMUNITY)
Admission: EM | Admit: 2018-10-23 | Discharge: 2018-10-24 | Disposition: A | Discharge status: Home / Self Care | Payer: Self-pay | Attending: Emergency Medicine | Admitting: Emergency Medicine

## 2018-10-23 DIAGNOSIS — F909 Attention-deficit hyperactivity disorder, unspecified type: Secondary | ICD-10-CM | POA: Insufficient documentation

## 2018-10-23 DIAGNOSIS — R002 Palpitations: Secondary | ICD-10-CM | POA: Insufficient documentation

## 2018-10-23 DIAGNOSIS — F17210 Nicotine dependence, cigarettes, uncomplicated: Secondary | ICD-10-CM | POA: Insufficient documentation

## 2018-10-23 LAB — CBC
HCT: 43.2 % (ref 36.0–46.0)
Hemoglobin: 14.1 g/dL (ref 12.0–15.0)
MCH: 30.5 pg (ref 26.0–34.0)
MCHC: 32.6 g/dL (ref 30.0–36.0)
MCV: 93.5 fL (ref 80.0–100.0)
PLATELETS: 252 10*3/uL (ref 150–400)
RBC: 4.62 MIL/uL (ref 3.87–5.11)
RDW: 12.3 % (ref 11.5–15.5)
WBC: 7.5 10*3/uL (ref 4.0–10.5)
nRBC: 0 % (ref 0.0–0.2)

## 2018-10-23 LAB — BASIC METABOLIC PANEL
Anion gap: 11 (ref 5–15)
BUN: 14 mg/dL (ref 6–20)
CALCIUM: 9.6 mg/dL (ref 8.9–10.3)
CO2: 22 mmol/L (ref 22–32)
CREATININE: 0.76 mg/dL (ref 0.44–1.00)
Chloride: 106 mmol/L (ref 98–111)
GFR calc Af Amer: >60 mL/min (ref >60)
GFR calc non Af Amer: >60 mL/min (ref >60)
GLUCOSE: 119 mg/dL — AB (ref 70–99)
Potassium: 4.2 mmol/L (ref 3.5–5.1)
Sodium: 139 mmol/L (ref 135–145)

## 2018-10-23 LAB — I-STAT BETA HCG BLOOD, ED (MC, WL, AP ONLY): I-stat hCG, quantitative: <5 m[IU]/mL (ref <5)

## 2018-10-23 NOTE — ED Triage Notes (Signed)
Pt here stating she was seen 2 days ago for PVCs.  Prescribed propanolol and states it is not working.  A&Ox4 no distress noted.  Pt states she is anxious about the palpitations.

## 2018-10-24 MED ORDER — ALUM & MAG HYDROXIDE-SIMETH 200-200-20 MG/5ML PO SUSP
15.0000 mL | Freq: Once | ORAL | Status: AC
  Administered 2018-10-24: 15 mL via ORAL
  Filled 2018-10-24: qty 30
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
## 2018-10-24 NOTE — Discharge Instructions (Signed)
Continue propranolol as prescribed.  Follow-up with your primary care doctor.  You may also follow-up with cardiology.  It is possible that your PACs may be brought on by, or continue due to, untreated anxiety.  Discussed this with your primary doctor at your follow-up visit.
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
## 2018-10-28 ENCOUNTER — Ambulatory Visit (INDEPENDENT_AMBULATORY_CARE_PROVIDER_SITE_OTHER): Payer: Self-pay | Admitting: Cardiology

## 2018-10-28 ENCOUNTER — Encounter: Payer: Self-pay | Admitting: Cardiology

## 2018-10-28 VITALS — BP 106/72 | HR 80 | Ht 68.0 in | Wt 188.6 lb

## 2018-10-28 DIAGNOSIS — R002 Palpitations: Secondary | ICD-10-CM

## 2018-10-28 DIAGNOSIS — F17200 Nicotine dependence, unspecified, uncomplicated: Secondary | ICD-10-CM

## 2018-10-28 LAB — TSH: TSH: 1.77 u[IU]/mL (ref 0.450–4.500)

## 2018-10-28 LAB — MAGNESIUM: Magnesium: 2.2 mg/dL (ref 1.6–2.3)

## 2018-10-28 MED ORDER — METOPROLOL SUCCINATE ER 25 MG PO TB24: 25.0000 mg | ORAL_TABLET | Freq: Every day | ORAL | 3 refills | Status: DC

## 2018-10-28 NOTE — Patient Instructions (Signed)
Medication Instructions:  STOP PROPRANOLOL  START METOPROLOL SUCC ER 25 MG ONCE DAILY AT BEDTIME If you need a refill on your cardiac medications before your next appointment, please call your pharmacy.   Lab work: Your physician recommends that you HAVE LAB WORK TODAY If you have labs (blood work) drawn today and your tests are completely normal, you will receive your results only by: Marland Kitchen MyChart Message (if you have MyChart) OR . A paper copy in the mail If you have any lab test that is abnormal or we need to change your treatment, we will call you to review the results.  Testing/Procedures: Your physician has requested that you have an echocardiogram. Echocardiography is a painless test that uses sound waves to create images of your heart. It provides your doctor with information about the size and shape of your heart and how well your heart's chambers and valves are working. This procedure takes approximately one hour. There are no restrictions for this procedure.    Follow-Up: Your physician recommends that you schedule a follow-up appointment in: 3 MONTHS WITH DR Jens Som
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
## 2018-10-29 ENCOUNTER — Telehealth: Payer: Self-pay | Admitting: Cardiology

## 2018-10-29 NOTE — Telephone Encounter (Signed)
Follow up  ° ° °Patient is returning call.  °

## 2018-10-29 NOTE — Telephone Encounter (Signed)
Left message to call back  

## 2018-10-29 NOTE — Telephone Encounter (Signed)
Pt c/o medication issue:  1. Name of Medication: Metoprolol  2. How are you currently taking this medication (dosage and times per day)? One time at night  3. Are you having a reaction (difficulty breathing--STAT)? no  4. What is your medication issue? Chest pains that felt like they were stabbing her, unable to sleep, and palpitationsi

## 2018-10-29 NOTE — Telephone Encounter (Signed)
Returned call to patient.She stated Dr.Crenshaw stopped propranolol and she started taking metoprolol succ 25 mg daily.She took first tablet last night.Stated it took her 3 hours to fall asleep.She had episode of stabbing pain in left chest last night with palpitations.Stated she is very irritable today.She has not had any more palpitations and chest pain.Advised try taking metoprolol again tonight.Advised to call back if she continues to have problems.

## 2018-10-29 NOTE — Progress Notes (Signed)
Results letter mailed to pt

## 2018-10-29 NOTE — Telephone Encounter (Signed)
Wrong provider

## 2018-10-30 ENCOUNTER — Telehealth: Payer: Self-pay | Admitting: Cardiology

## 2018-10-30 MED ORDER — METOPROLOL SUCCINATE ER 25 MG PO TB24: 50.0000 mg | ORAL_TABLET | Freq: Every day | ORAL | Status: DC

## 2018-10-30 NOTE — Telephone Encounter (Signed)
Spoke with pt. Pt aware of Dr.Crenshaws recommendation to increase Metoprolol to 50mg  daily. Recommended that pt monitor her BP and HR regularly. Pt sts that she will stop at [facility] and purchase one this afternoon. Adv pt to contact the office if symptoms persist, or her BP or HR are to low. Adv pt of normal BP and HR parameters. Pt agreeable with plan and verbalized understanding.

## 2018-10-30 NOTE — Telephone Encounter (Signed)
Change toprol to 50 mg daily and follow BP and HR Olga Millers

## 2018-10-30 NOTE — Telephone Encounter (Signed)
Spoke with patient and she has been on the Toprol 25 mg daily and this is not helping her palpations at all. She is having them like when she went to ED. Her drowsiness is better on Toprol but stated she would take drowsiness over palpitations. Will forward to Dr Liliane Bade for review

## 2018-10-30 NOTE — Telephone Encounter (Signed)
Patient c/o Palpitations:  High priority if patient c/o lightheadedness, shortness of breath, or chest pain  1) How long have you had palpitations/irregular HR/ Afib? Are you having the symptoms now? 2 weeks and yes having them now.  2) Are you currently experiencing lightheadedness, SOB or CP? No  3) Do you have a history of afib (atrial fibrillation) or irregular heart rhythm? No  4) Have you checked your BP or HR? (document readings if available):No  5) Are you experiencing any other symptoms? No

## 2018-11-03 ENCOUNTER — Encounter: Payer: Self-pay | Admitting: Adult Health

## 2018-11-03 ENCOUNTER — Telehealth: Payer: Self-pay | Admitting: *Deleted

## 2018-11-03 ENCOUNTER — Ambulatory Visit (INDEPENDENT_AMBULATORY_CARE_PROVIDER_SITE_OTHER): Payer: Self-pay | Admitting: Adult Health

## 2018-11-03 VITALS — BP 120/80 | HR 77 | Ht 68.0 in | Wt 189.4 lb

## 2018-11-03 DIAGNOSIS — R002 Palpitations: Secondary | ICD-10-CM

## 2018-11-03 MED ORDER — PROPRANOLOL HCL 10 MG PO TABS: 10.0000 mg | ORAL_TABLET | Freq: Three times a day (TID) | ORAL | 5 refills | Status: DC

## 2018-11-03 NOTE — Patient Instructions (Signed)
Medication Instructions:  STOP METOPROLOL  START PROPRANOLOL 10MG  THREE TIMES DAILY  If you need a refill on your cardiac medications before your next appointment, please call your pharmacy.  Labwork: If you have labs (blood work) drawn today and your tests are completely normal, you will receive your results only by: Marland Kitchen MyChart Message (if you have MyChart) OR . A paper copy in the mail If you have any lab test that is abnormal or we need to change your treatment, we will call you to review the results.  Special Instructions: WE WILL CALL YOU WITH ECHO RESULTS   Follow-Up: You will need a follow up appointment in 1 WEEK.  You may see  DR Sheffield Slider, DNP, ANP or one of the following Advanced Practice Providers on your designated Care Team:   . Corine Shelter, PA-C  At [facility], you and your health needs are our priority.  As part of our continuing mission to provide you with exceptional heart care, we have created designated Provider Care Teams.  These Care Teams include your primary Cardiologist (physician) and Advanced Practice Providers (APPs -  Physician Assistants and Nurse Practitioners) who all work together to provide you with the care you need, when you need it.  Thank you for choosing [facility] at [facility]!!
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
## 2018-11-03 NOTE — Telephone Encounter (Signed)
Received call from patient-patient states she saw Dr. Jens Som 10/29 and was switched to metoprolol succinate for palpitations.   She was on propanolol prior and was doing great other than causing her fatigue.    She states since she was switched she is "suffering", her palpitations are very frequent "2-3 per minute".  She is very frustrated and would like to switch back to the propanolol.   She states her BP and HR have been ok, 110-120/70-80 and HR 80s.    appt scheduled for today at 9:30 with Harriet Pho DNP.  She agreed and verbalized understanding.

## 2018-11-03 NOTE — Telephone Encounter (Signed)
Here for appt

## 2018-11-04 ENCOUNTER — Other Ambulatory Visit: Payer: Self-pay

## 2018-11-04 ENCOUNTER — Ambulatory Visit (HOSPITAL_COMMUNITY): Payer: Self-pay | Attending: Cardiology

## 2018-11-04 DIAGNOSIS — R002 Palpitations: Secondary | ICD-10-CM | POA: Insufficient documentation

## 2018-11-11 ENCOUNTER — Ambulatory Visit: Payer: Self-pay | Admitting: Adult Health
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
## 2018-11-12 ENCOUNTER — Telehealth: Payer: Self-pay

## 2018-11-12 NOTE — Telephone Encounter (Signed)
-----   Message from Lewayne BuntingBrian S Crenshaw, MD sent at 11/04/2018  4:34 PM EST ----- Normal LV function Olga MillersBrian Crenshaw

## 2018-11-12 NOTE — Telephone Encounter (Signed)
2nd attempt.Called pt to give echo results. Unable to lmom pt phone rings out.

## 2018-12-03 ENCOUNTER — Telehealth: Payer: Self-pay | Admitting: Cardiology

## 2018-12-03 ENCOUNTER — Telehealth: Payer: Self-pay | Admitting: Adult Health

## 2018-12-03 NOTE — Telephone Encounter (Signed)
New Message   Patient is calling because she has a sinus infection. She wants to know what over the counter medication can she take. Please call to discuss.

## 2018-12-03 NOTE — Telephone Encounter (Signed)
Routed to K. Lyman BishopLawrence DNP to advise

## 2018-12-03 NOTE — Telephone Encounter (Signed)
Pt called after hours number to discuss her cold symptoms and for advise on what she can take. She sounds congested and complains of sore throat and headache/sinus pressure. I advised that she can take mucinex or plain robitussin and drink fluids with it to loosen mucus. Avoid pseudophed/decongestant. Can take tylenol for discomfort. I also advised her that she can go to urgent care if needed tomorrow.

## 2018-12-04 NOTE — Telephone Encounter (Signed)
Returned call to pt she states that she was feeling soo bad she called back to last night and spoke with on call for direction. She states that she is getting better and will call PCP if sx worsen

## 2018-12-04 NOTE — Telephone Encounter (Signed)
She can take Muccinex and Corcidin HBP for symptom relief.  She will need to see PCP for antibiotics if necessary.

## 2018-12-06 ENCOUNTER — Other Ambulatory Visit: Payer: Self-pay

## 2018-12-06 ENCOUNTER — Emergency Department (HOSPITAL_COMMUNITY): Payer: Self-pay

## 2018-12-06 ENCOUNTER — Encounter (HOSPITAL_COMMUNITY): Payer: Self-pay

## 2018-12-06 ENCOUNTER — Emergency Department (HOSPITAL_COMMUNITY)
Admission: EM | Admit: 2018-12-06 | Discharge: 2018-12-07 | Disposition: A | Discharge status: Home / Self Care | Payer: Self-pay | Attending: Emergency Medicine | Admitting: Emergency Medicine

## 2018-12-06 DIAGNOSIS — F909 Attention-deficit hyperactivity disorder, unspecified type: Secondary | ICD-10-CM | POA: Insufficient documentation

## 2018-12-06 DIAGNOSIS — R002 Palpitations: Secondary | ICD-10-CM | POA: Insufficient documentation

## 2018-12-06 DIAGNOSIS — R0789 Other chest pain: Secondary | ICD-10-CM | POA: Insufficient documentation

## 2018-12-06 DIAGNOSIS — F319 Bipolar disorder, unspecified: Secondary | ICD-10-CM | POA: Insufficient documentation

## 2018-12-06 DIAGNOSIS — Z79899 Other long term (current) drug therapy: Secondary | ICD-10-CM | POA: Insufficient documentation

## 2018-12-06 DIAGNOSIS — F17210 Nicotine dependence, cigarettes, uncomplicated: Secondary | ICD-10-CM | POA: Insufficient documentation

## 2018-12-06 DIAGNOSIS — R0602 Shortness of breath: Secondary | ICD-10-CM | POA: Insufficient documentation

## 2018-12-06 LAB — CBC WITH DIFFERENTIAL/PLATELET
Abs Immature Granulocytes: 0.02 10*3/uL (ref 0.00–0.07)
Basophils Absolute: 0 10*3/uL (ref 0.0–0.1)
Basophils Relative: 0 %
EOS PCT: 3 %
Eosinophils Absolute: 0.2 10*3/uL (ref 0.0–0.5)
HCT: 43.5 % (ref 36.0–46.0)
Hemoglobin: 14.2 g/dL (ref 12.0–15.0)
Immature Granulocytes: 0 %
Lymphocytes Relative: 25 %
Lymphs Abs: 1.5 10*3/uL (ref 0.7–4.0)
MCH: 30.5 pg (ref 26.0–34.0)
MCHC: 32.6 g/dL (ref 30.0–36.0)
MCV: 93.3 fL (ref 80.0–100.0)
Monocytes Absolute: 0.7 10*3/uL (ref 0.1–1.0)
Monocytes Relative: 12 %
Neutro Abs: 3.5 10*3/uL (ref 1.7–7.7)
Neutrophils Relative %: 60 %
Platelets: 202 10*3/uL (ref 150–400)
RBC: 4.66 MIL/uL (ref 3.87–5.11)
RDW: 12.1 % (ref 11.5–15.5)
WBC: 5.9 10*3/uL (ref 4.0–10.5)
nRBC: 0 % (ref 0.0–0.2)

## 2018-12-06 LAB — BASIC METABOLIC PANEL
Anion gap: 12 (ref 5–15)
BUN: 18 mg/dL (ref 6–20)
CHLORIDE: 105 mmol/L (ref 98–111)
CO2: 22 mmol/L (ref 22–32)
CREATININE: 0.75 mg/dL (ref 0.44–1.00)
Calcium: 9.3 mg/dL (ref 8.9–10.3)
GFR calc Af Amer: >60 mL/min (ref >60)
GFR calc non Af Amer: >60 mL/min (ref >60)
Glucose, Bld: 109 mg/dL — ABNORMAL HIGH (ref 70–99)
Potassium: 4.1 mmol/L (ref 3.5–5.1)
Sodium: 139 mmol/L (ref 135–145)

## 2018-12-06 MED ORDER — IPRATROPIUM BROMIDE 0.02 % IN SOLN
0.5000 mg | Freq: Once | RESPIRATORY_TRACT | Status: AC
  Administered 2018-12-06: 0.5 mg via RESPIRATORY_TRACT
  Filled 2018-12-06: qty 2.5

## 2018-12-06 NOTE — ED Triage Notes (Signed)
Pt here for shortness of breath and congestion for the last 3 days.  States harder to take a deep breath unless she raises arms up.  A&Ox4 breath sounds clear
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
## 2018-12-06 NOTE — ED Notes (Signed)
Patient transported to X-ray 

## 2018-12-07 MED ORDER — PREDNISONE 20 MG PO TABS
60.0000 mg | ORAL_TABLET | Freq: Once | ORAL | Status: AC
  Administered 2018-12-07: 60 mg via ORAL
  Filled 2018-12-07: qty 3

## 2018-12-07 MED ORDER — IPRATROPIUM-ALBUTEROL 0.5-2.5 (3) MG/3ML IN SOLN
5.0000 mL | Freq: Once | RESPIRATORY_TRACT | Status: AC
  Administered 2018-12-07: 5 mL via RESPIRATORY_TRACT
  Filled 2018-12-07: qty 3

## 2018-12-07 MED ORDER — PREDNISONE 10 MG PO TABS: 20.0000 mg | ORAL_TABLET | Freq: Every day | ORAL | 0 refills | Status: AC

## 2018-12-07 MED ORDER — IPRATROPIUM BROMIDE HFA 17 MCG/ACT IN AERS: 2.0000 | INHALATION_SPRAY | RESPIRATORY_TRACT | 12 refills | Status: DC | PRN

## 2018-12-07 NOTE — Discharge Instructions (Signed)
You have been seen today for shortness of breath. Please read and follow all provided instructions.   1. Medications: prednisone and ipratropium for shortness of breath, usual home medications 2. Treatment: rest, drink plenty of fluids 3. Follow Up: Please follow up with your primary doctor in 2 days for discussion of your diagnoses and further evaluation after today's visit; if you do not have a primary care doctor use the resource guide provided to find one; Please return to the ER for any new or worsening symptoms. Please obtain all of your results from medical records or have your doctors office obtain the results - share them with your doctor - you should be seen at your doctors office. Call today to arrange your follow up.   Take medications as prescribed. Please review all of the medicines and only take them if you do not have an allergy to them. Return to the emergency room for worsening condition or new concerning symptoms. Follow up with your regular doctor. If you don't have a regular doctor use one of the numbers below to establish a primary care doctor.  Please be aware that if you are taking birth control pills, taking other prescriptions, ESPECIALLY ANTIBIOTICS may make the birth control ineffective - if this is the case, either do not engage in sexual activity or use alternative methods of birth control such as condoms until you have finished the medicine and your family doctor says it is OK to restart them. If you are on a blood thinner such as COUMADIN, be aware that any other medicine that you take may cause the coumadin to either work too much, or not enough - you should have your coumadin level rechecked in next 7 days if this is the case.  ?  It is also a possibility that you have an allergic reaction to any of the medicines that you have been prescribed - Everybody reacts differently to medications and while MOST people have no trouble with most medicines, you may have a reaction  such as nausea, vomiting, rash, swelling, shortness of breath. If this is the case, please stop taking the medicine immediately and contact your physician.  ?  You should return to the ER if you develop severe or worsening symptoms.   Emergency Department Resource Guide 1) Find a Doctor and Pay Out of Pocket Although you won't have to find out who is covered by your insurance plan, it is a good idea to ask around and get recommendations. You will then need to call the office and see if the doctor you have chosen will accept you as a new patient and what types of options they offer for patients who are self-pay. Some doctors offer discounts or will set up payment plans for their patients who do not have insurance, but you will need to ask so you aren't surprised when you get to your appointment.  2) Contact Your Local Health Department Not all health departments have doctors that can see patients for sick visits, but many do, so it is worth a call to see if yours does. If you don't know where your local health department is, you can check in your phone book. The CDC also has a tool to help you locate your state's health department, and many state websites also have listings of all of their local health departments.  3) Find a Walk-in Clinic If your illness is not likely to be very severe or complicated, you may want to try a walk  in clinic. These are popping up all over the country in pharmacies, drugstores, and shopping centers. They're usually staffed by nurse practitioners or physician assistants that have been trained to treat common illnesses and complaints. They're usually fairly quick and inexpensive. However, if you have serious medical issues or chronic medical problems, these are probably not your best option.  No Primary Care Doctor: Call Health Connect at  [phone] - they can help you locate a primary care doctor that  accepts your insurance, provides certain services, etc. Physician  Referral [phone]  Emergency Department Resource Guide 1) Find a Doctor and Pay Out of Pocket Although you won't have to find out who is covered by your insurance plan, it is a good idea to ask around and get recommendations. You will then need to call the office and see if the doctor you have chosen will accept you as a new patient and what types of options they offer for patients who are self-pay. Some doctors offer discounts or will set up payment plans for their patients who do not have insurance, but you will need to ask so you aren't surprised when you get to your appointment.  2) Contact Your Local Health Department Not all health departments have doctors that can see patients for sick visits, but many do, so it is worth a call to see if yours does. If you don't know where your local health department is, you can check in your phone book. The CDC also has a tool to help you locate your state's health department, and many state websites also have listings of all of their local health departments.  3) Find a [facility] If your illness is not likely to be very severe or complicated, you may want to try a walk in clinic. These are popping up all over the country in pharmacies, drugstores, and shopping centers. They're usually staffed by nurse practitioners or physician assistants that have been trained to treat common illnesses and complaints. They're usually fairly quick and inexpensive. However, if you have serious medical issues or chronic medical problems, these are probably not your best option.  No Primary Care Doctor: Call Health Connect at  [phone] - they can help you locate a primary care doctor that  accepts your insurance, provides certain services, etc. Physician Referral Service- [phone]  Chronic Pain Problems: Organization         Address  Phone   Notes  [facility]  [phone] Patients need to be referred by their primary care  doctor.   Medication Assistance: Organization         Address  Phone   Notes  [facility] Medication [facility] [address] [place], [place] [address] [phone] --Must be a resident of [facility] -- Must have NO insurance coverage whatsoever (no Medicaid/ Medicare, etc.) -- The pt. MUST have a primary care doctor that directs their care regularly and follows them in the community   [facility]  [phone]   [employer]  [phone]    Agencies that provide inexpensive medical care: Organization         Address  Phone   Notes  [place]  [phone]   [facility]    [phone]   [facility] [place], [place] [address] [phone]   [place] [address] [place] [phone]   Planned Parenthood    (  [phone]   [facility]    [phone]   [facility] and [facility]  [address], [place] Phone:  [phone], Fax:  [phone] Hours of Operation:  9 am - 6 pm, M-F.  Also accepts Medicaid/Medicare and self-pay.  [facility] for [place], [address], [place] Phone: [phone], Fax: [phone]. Hours of Operation:  8:30 am - 5:30 pm, M-F.  Also accepts Medicaid and self-pay.  [facility] [place] [address], [place] Phone: [phone]   [place], [place], [place] [phone], Ext. [phone] Mondays & Thursdays: 7-9 AM.  First 15 patients are seen on a first come, first serve basis.    [place] Providers:  Organization         Address  Phone   Notes  [facility] [address], [place] [phone] Also accepts self-pay patients.  [facility] [address], [place]  [phone]   [place], [address], [place] [phone]   [facility] [address] [place] [phone]   Lucianne Lei [address], [place]   [phone] Only accepts [place] Access [place] patients after they have their name applied to their card.   Self-Pay (no insurance) in [facility]:  Organization         Address  Phone   Notes  Sickle Cell Patients, [facility] [place] [phone]   [facility] [place] [phone]   [facility] [place]  [place], [place], [place] [phone]   [facility]/Dr. Osei-Bonsu  [address], [place] or [address], [place] [phone] Phone number for both [place] and [place] locations is the same.  [facility] and [facility] [address], [place] [phone]   [facility] [address], [place] or [address] [phone]   [facility] [address] [place] [phone], phone; [phone], fax Sees patients 1st and 3rd Saturday of every month.  Must not qualify for public or private insurance (i.e. Medicaid, Medicare, [place] Health Choice, Veterans' Benefits)  Household income should be no more than 200% of the poverty level The clinic cannot treat you if you are pregnant or think you are pregnant  Sexually transmitted diseases are not treated at the clinic.

## 2018-12-07 NOTE — ED Notes (Signed)
Pt ambulated in the hallway without difficulty, O2 saturations remained  98%-100% the entire time.

## 2018-12-07 NOTE — ED Notes (Signed)
Patient Alert and oriented to baseline. Stable and ambulatory to baseline. Patient verbalized understanding of the discharge instructions.  Patient belongings were taken by the patient.   
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
## 2019-02-02 ENCOUNTER — Ambulatory Visit (INDEPENDENT_AMBULATORY_CARE_PROVIDER_SITE_OTHER): Payer: Self-pay | Admitting: Cardiology

## 2019-02-02 ENCOUNTER — Encounter: Payer: Self-pay | Admitting: Cardiology

## 2019-02-02 VITALS — BP 114/82 | HR 84 | Ht 68.0 in | Wt 205.0 lb

## 2019-02-02 DIAGNOSIS — R002 Palpitations: Secondary | ICD-10-CM

## 2019-02-02 DIAGNOSIS — F17200 Nicotine dependence, unspecified, uncomplicated: Secondary | ICD-10-CM

## 2019-02-02 MED ORDER — PROPRANOLOL HCL 10 MG PO TABS: 10.0000 mg | ORAL_TABLET | Freq: Three times a day (TID) | ORAL | 3 refills | Status: DC

## 2019-02-02 NOTE — Patient Instructions (Signed)
Medication Instructions:  Refill sent to the pharmacy electronically.  If you need a refill on your cardiac medications before your next appointment, please call your pharmacy.   Lab work: If you have labs (blood work) drawn today and your tests are completely normal, you will receive your results only by: Marland Kitchen. MyChart Message (if you have MyChart) OR . A paper copy in the mail If you have any lab test that is abnormal or we need to change your treatment, we will call you to review the results.  Follow-Up: At [facility], you and your health needs are our priority.  As part of our continuing mission to provide you with exceptional heart care, we have created designated Provider Care Teams.  These Care Teams include your primary Cardiologist (physician) and Advanced Practice Providers (APPs -  Physician Assistants and Nurse Practitioners) who all work together to provide you with the care you need, when you need it. You will need a follow up appointment in 12 months.  Please call our office 2 months in advance to schedule this appointment.  You may see Olga MillersBRIAN CRENSHAW MD or one of the following Advanced Practice Providers on your designated Care Team:   Corine ShelterLuke Kilroy, PA-C Judy PimpleKrista Kroeger, [place]-C . Marjie Skiffallie [place], PA-C  CALL IN December TO SCHEDULE APPOINTMENT IN [place]

## 2019-04-08 ENCOUNTER — Telehealth: Payer: Self-pay | Admitting: Family

## 2019-04-08 DIAGNOSIS — R6889 Other general symptoms and signs: Principal | ICD-10-CM

## 2019-04-08 DIAGNOSIS — Z20822 Contact with and (suspected) exposure to covid-19: Secondary | ICD-10-CM

## 2019-04-08 MED ORDER — BENZONATATE 100 MG PO CAPS: 100.0000 mg | ORAL_CAPSULE | Freq: Three times a day (TID) | ORAL | 0 refills | Status: DC | PRN

## 2019-04-08 MED ORDER — ALBUTEROL SULFATE HFA 108 (90 BASE) MCG/ACT IN AERS: 2.0000 | INHALATION_SPRAY | Freq: Four times a day (QID) | RESPIRATORY_TRACT | 0 refills | Status: DC | PRN

## 2019-04-08 NOTE — Progress Notes (Signed)
E-Visit for Corona Virus Screening  Based on your current symptoms, you may very well have the virus, however your symptoms are mild. Currently, not all patients are being tested. If the symptoms are mild and there is not a known exposure, performing the test is not indicated.  Approximately 5 minutes was spent documenting and reviewing patient's chart.   Coronavirus disease 2019 (COVID-19) is a respiratory illness that can spread from person to person. The virus that causes COVID-19 is a new virus that was first identified in the country of [place] but is now found in multiple other countries and has spread to the [place].  Symptoms associated with the virus are mild to severe fever, cough, and shortness of breath. There is currently no vaccine to protect against COVID-19, and there is no specific antiviral treatment for the virus.   To be considered HIGH RISK for Coronavirus (COVID-19), you have to meet the following criteria:  . Traveled to [place], [place], [place], [place] or [place]; or in the [place] to [place], [place], [place], or [place]; and have fever, cough, and shortness of breath within the last 2 weeks of travel OR  . Been in close contact with a person diagnosed with COVID-19 within the last 2 weeks and have fever, cough, and shortness of breath  . IF YOU DO NOT MEET THESE CRITERIA, YOU ARE CONSIDERED LOW RISK FOR COVID-19.   It is vitally important that if you feel that you have an infection such as this virus or any other virus that you stay home and away from places where you may spread it to others.  You should self-quarantine for 14 days if you have symptoms that could potentially be coronavirus and avoid contact with people age 65 and older.   You can use medication such as A prescription cough medication called Tessalon Perles 100 mg. You may take 1-2 capsules every 8 hours as needed for cough and A prescription inhaler called Albuterol MDI 90 mcg /actuation 2  puffs every 4 hours as needed for shortness of breath, wheezing, cough.  You may also take acetaminophen (Tylenol) as needed for fever.   Reduce your risk of any infection by using the same precautions used for avoiding the common cold or flu:  . Wash your hands often with soap and warm water for at least 20 seconds.  If soap and water are not readily available, use an alcohol-based hand sanitizer with at least 60% alcohol.  . If coughing or sneezing, cover your mouth and nose by coughing or sneezing into the elbow areas of your shirt or coat, into a tissue or into your sleeve (not your hands). . Avoid shaking hands with others and consider head nods or verbal greetings only. . Avoid touching your eyes, nose, or mouth with unwashed hands.  . Avoid close contact with people who are sick. . Avoid places or events with large numbers of people in one location, like concerts or sporting events. . Carefully consider travel plans you have or are making. . If you are planning any travel outside or inside the [place], visit the CDC's Travelers' Health webpage for the latest health notices. . If you have some symptoms but not all symptoms, continue to monitor at home and seek medical attention if your symptoms worsen. . If you are having a medical emergency, call 911.  HOME CARE . Only take medications as instructed by your medical team. . Drink plenty of fluids and get plenty   of rest. . A steam or ultrasonic humidifier can help if you have congestion.   GET HELP RIGHT AWAY IF: . You develop worsening fever. . You become short of breath . You cough up blood. . Your symptoms become more severe MAKE SURE YOU   Understand these instructions.  Will watch your condition.  Will get help right away if you are not doing well or get worse.  Your e-visit answers were reviewed by a board certified advanced clinical practitioner to complete your personal care plan.  Depending on the condition, your plan  could have included both over the counter or prescription medications.  If there is a problem please reply once you have received a response from your provider. Your safety is important to us.  If you have drug allergies check your prescription carefully.    You can use MyChart to ask questions about today's visit, request a non-urgent call back, or ask for a work or school excuse for 24 hours related to this e-Visit. If it has been greater than 24 hours you will need to follow up with your provider, or enter a new e-Visit to address those concerns. You will get an e-mail in the next two days asking about your experience.  I hope that your e-visit has been valuable and will speed your recovery. Thank you for using e-visits.    

## 2019-10-04 ENCOUNTER — Emergency Department: Payer: Self-pay

## 2019-10-04 ENCOUNTER — Emergency Department
Admission: EM | Admit: 2019-10-04 | Discharge: 2019-10-05 | Disposition: A | Discharge status: Home / Self Care | Payer: Self-pay | Attending: Emergency Medicine | Admitting: Emergency Medicine

## 2019-10-04 ENCOUNTER — Other Ambulatory Visit: Payer: Self-pay

## 2019-10-04 ENCOUNTER — Encounter: Payer: Self-pay | Admitting: Emergency Medicine

## 2019-10-04 DIAGNOSIS — R079 Chest pain, unspecified: Secondary | ICD-10-CM | POA: Insufficient documentation

## 2019-10-04 DIAGNOSIS — Z79899 Other long term (current) drug therapy: Secondary | ICD-10-CM | POA: Insufficient documentation

## 2019-10-04 DIAGNOSIS — Z87891 Personal history of nicotine dependence: Secondary | ICD-10-CM | POA: Insufficient documentation

## 2019-10-04 LAB — BASIC METABOLIC PANEL
Anion gap: 11 (ref 5–15)
BUN: 22 mg/dL — ABNORMAL HIGH (ref 6–20)
CO2: 23 mmol/L (ref 22–32)
Calcium: 9.3 mg/dL (ref 8.9–10.3)
Chloride: 104 mmol/L (ref 98–111)
Creatinine, Ser: 0.75 mg/dL (ref 0.44–1.00)
GFR calc Af Amer: >60 mL/min (ref >60)
GFR calc non Af Amer: >60 mL/min (ref >60)
Glucose, Bld: 148 mg/dL — ABNORMAL HIGH (ref 70–99)
Potassium: 3.6 mmol/L (ref 3.5–5.1)
Sodium: 138 mmol/L (ref 135–145)

## 2019-10-04 LAB — CBC
HCT: 41.3 % (ref 36.0–46.0)
Hemoglobin: 14.1 g/dL (ref 12.0–15.0)
MCH: 30.7 pg (ref 26.0–34.0)
MCHC: 34.1 g/dL (ref 30.0–36.0)
MCV: 90 fL (ref 80.0–100.0)
Platelets: 230 10*3/uL (ref 150–400)
RBC: 4.59 MIL/uL (ref 3.87–5.11)
RDW: 12.2 % (ref 11.5–15.5)
WBC: 8.2 10*3/uL (ref 4.0–10.5)
nRBC: 0 % (ref 0.0–0.2)

## 2019-10-04 LAB — TROPONIN I (HIGH SENSITIVITY): Troponin I (High Sensitivity): 2 ng/L (ref <18)

## 2019-10-04 NOTE — ED Triage Notes (Signed)
Pt arrives POV to triage with c/o left arm pain which she states feel like she has gotten a shot as well as mid sternum chest pain. Pt states this has been going on 3-4 days.

## 2019-10-05 ENCOUNTER — Other Ambulatory Visit: Payer: Self-pay

## 2019-10-05 DIAGNOSIS — Z20822 Contact with and (suspected) exposure to covid-19: Secondary | ICD-10-CM

## 2019-10-05 NOTE — ED Notes (Signed)
Patient left prior to receiving discharge instructions. 
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
## 2019-10-05 NOTE — Discharge Instructions (Signed)
You have been seen in the Emergency Department (ED) today for chest pain.  As we have discussed todays test results are normal, but you may require further testing.  Please follow up with the recommended doctor as instructed above in these documents regarding todays emergent visit and your recent symptoms to discuss further management.  Continue to take your regular medications.   Try taking ibuprofen 600 mg three times a day with meals for the next couple of days to see if it helps.  Return to the Emergency Department (ED) if you experience any further chest pain/pressure/tightness, difficulty breathing, or sudden sweating, or other symptoms that concern you.

## 2019-11-12 IMAGING — DX DG CHEST 2V
2 series · 2 of 2 positions shown · non-contrast
Comparison: 09/08/2017

CLINICAL DATA: Palpitation

EXAM:
CHEST - 2 VIEW

[w chest pa]
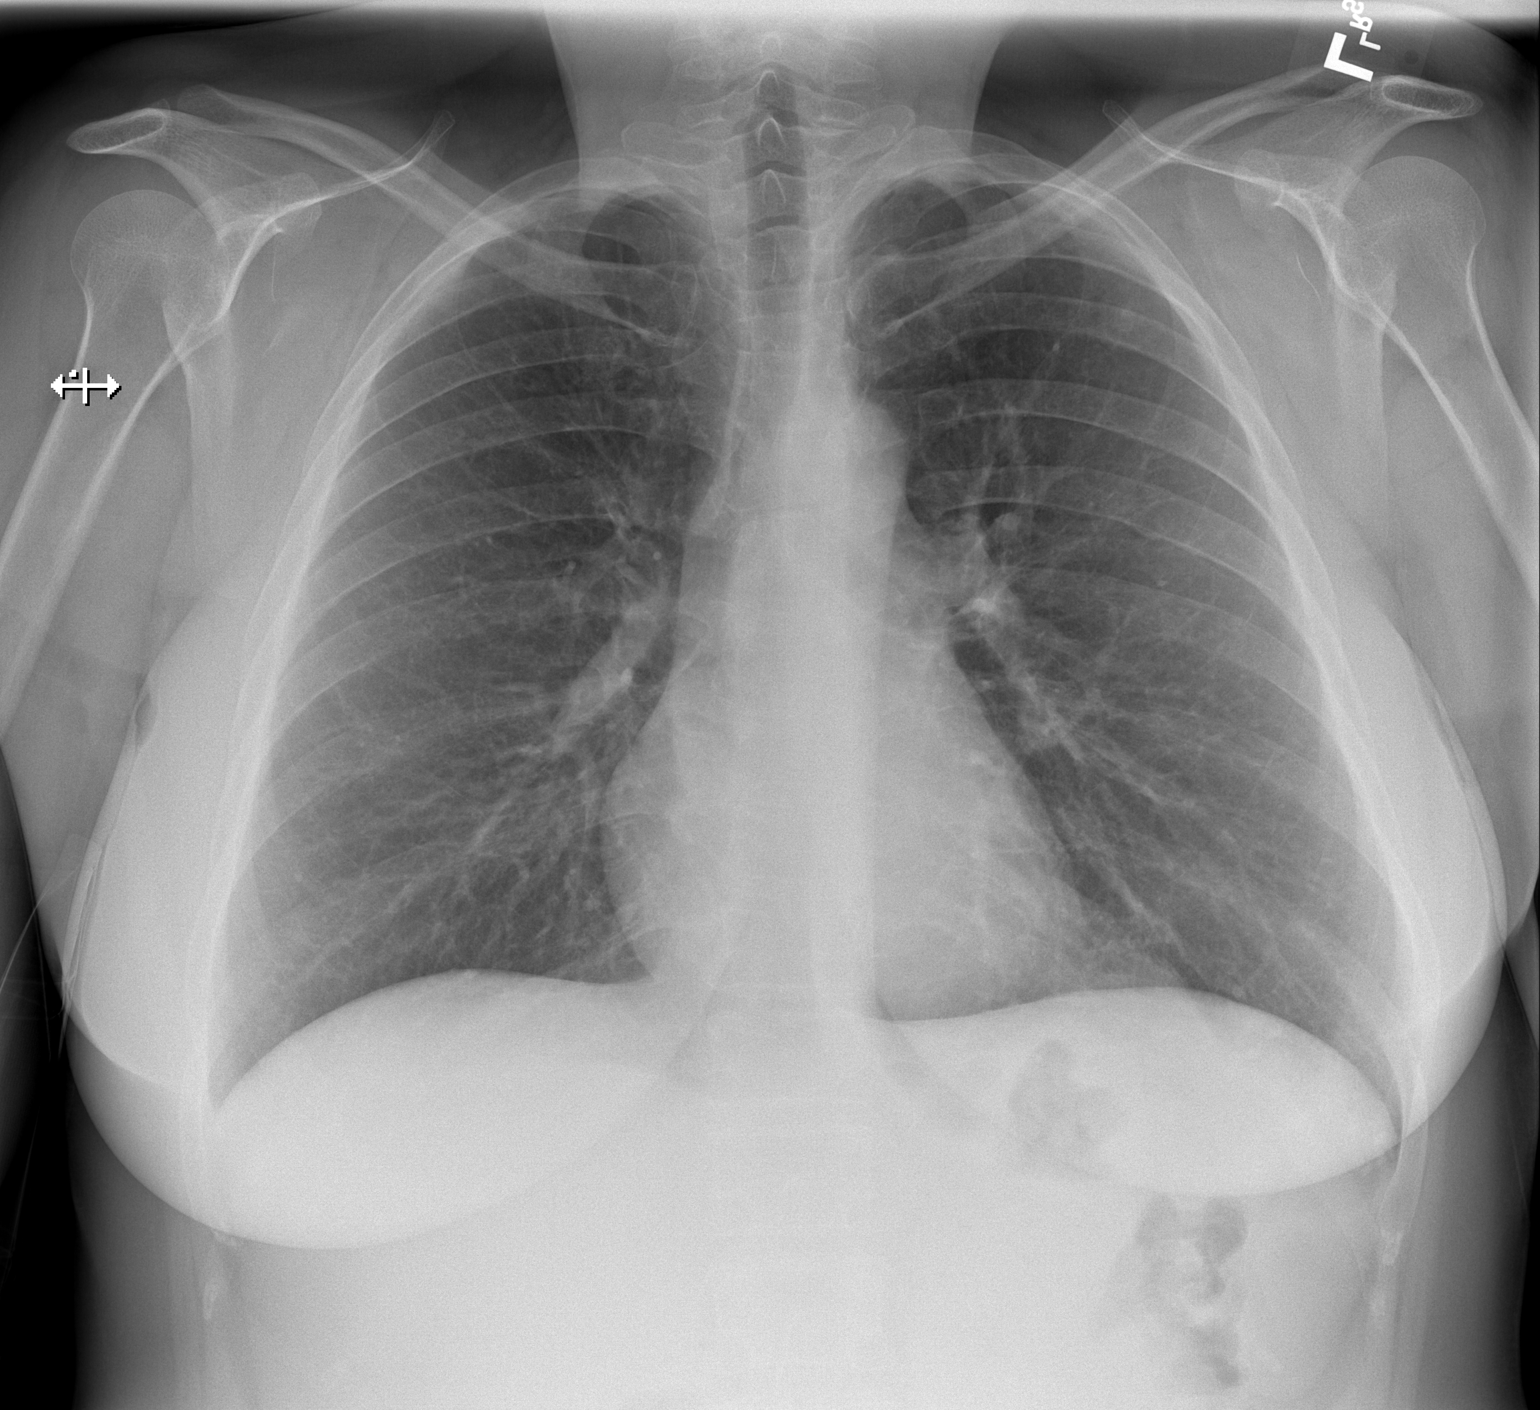

[w chest lat]
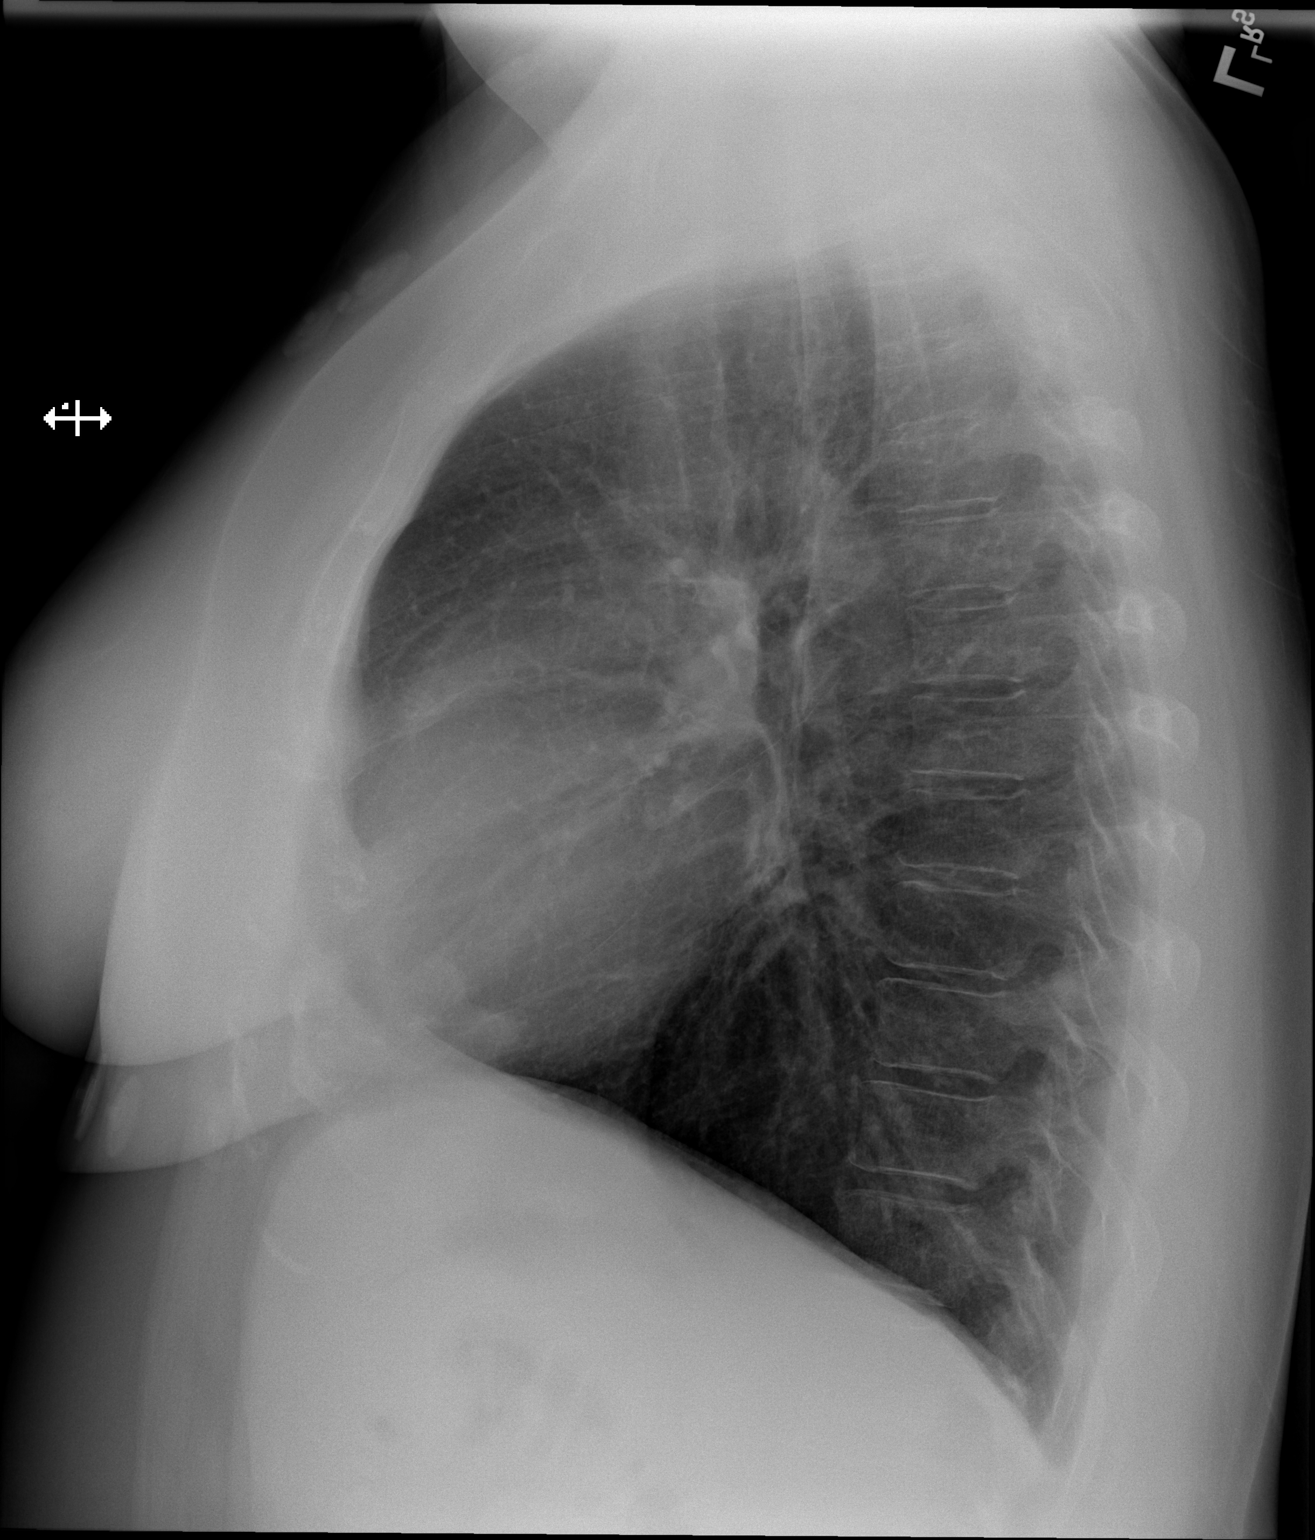

[2 of 2 positions shown; findings below may reference images not displayed]

FINDINGS: The heart size and mediastinal contours are within normal limits.
Both lungs are clear. The visualized skeletal structures are
unremarkable.
IMPRESSION: No active cardiopulmonary disease.

## 2019-11-14 IMAGING — CR DG CHEST 2V
2 series · 2 of 2 positions shown · non-contrast
Comparison: 10/21/2018

CLINICAL DATA: Palpitations

EXAM:
CHEST - 2 VIEW

[chest pa]
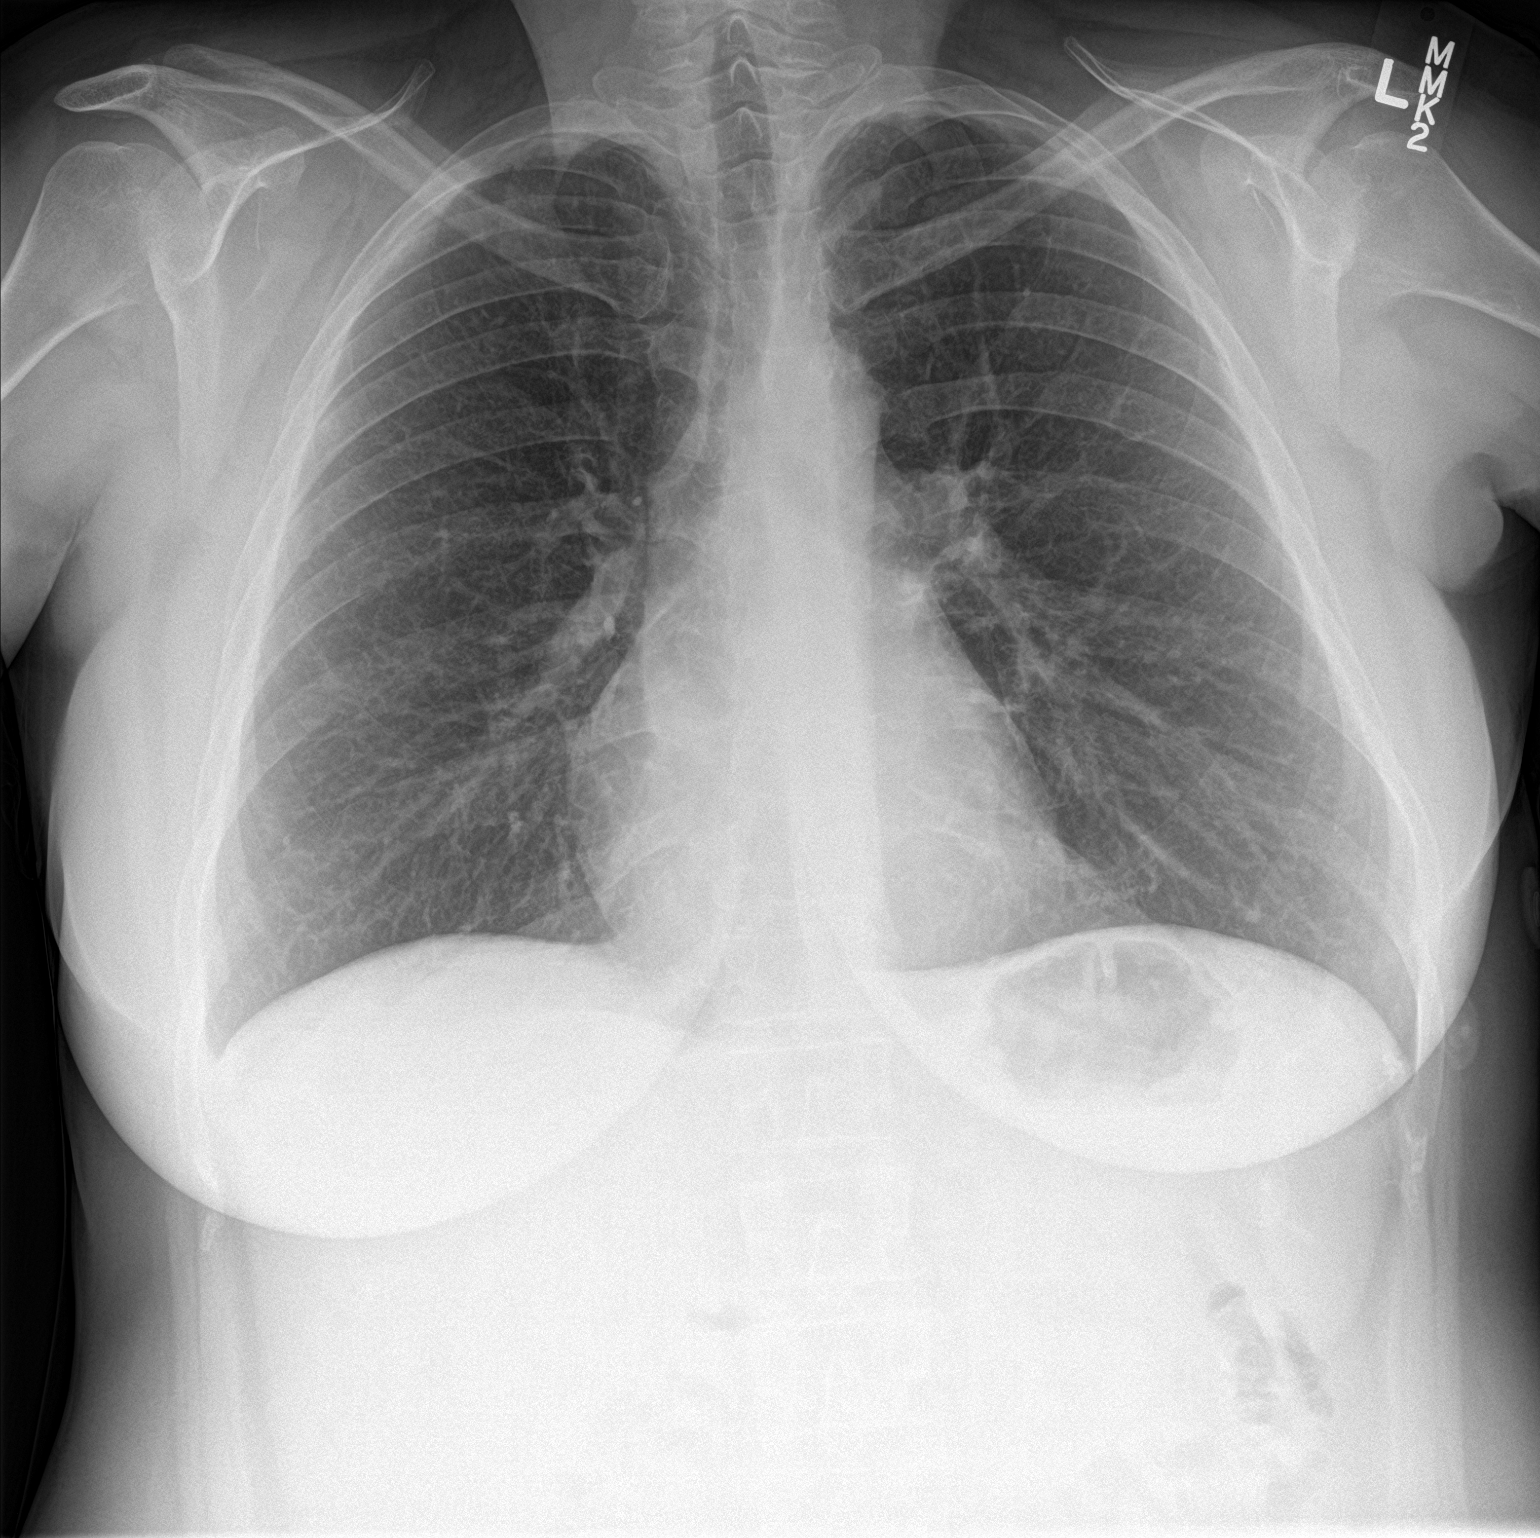

[chest lat]
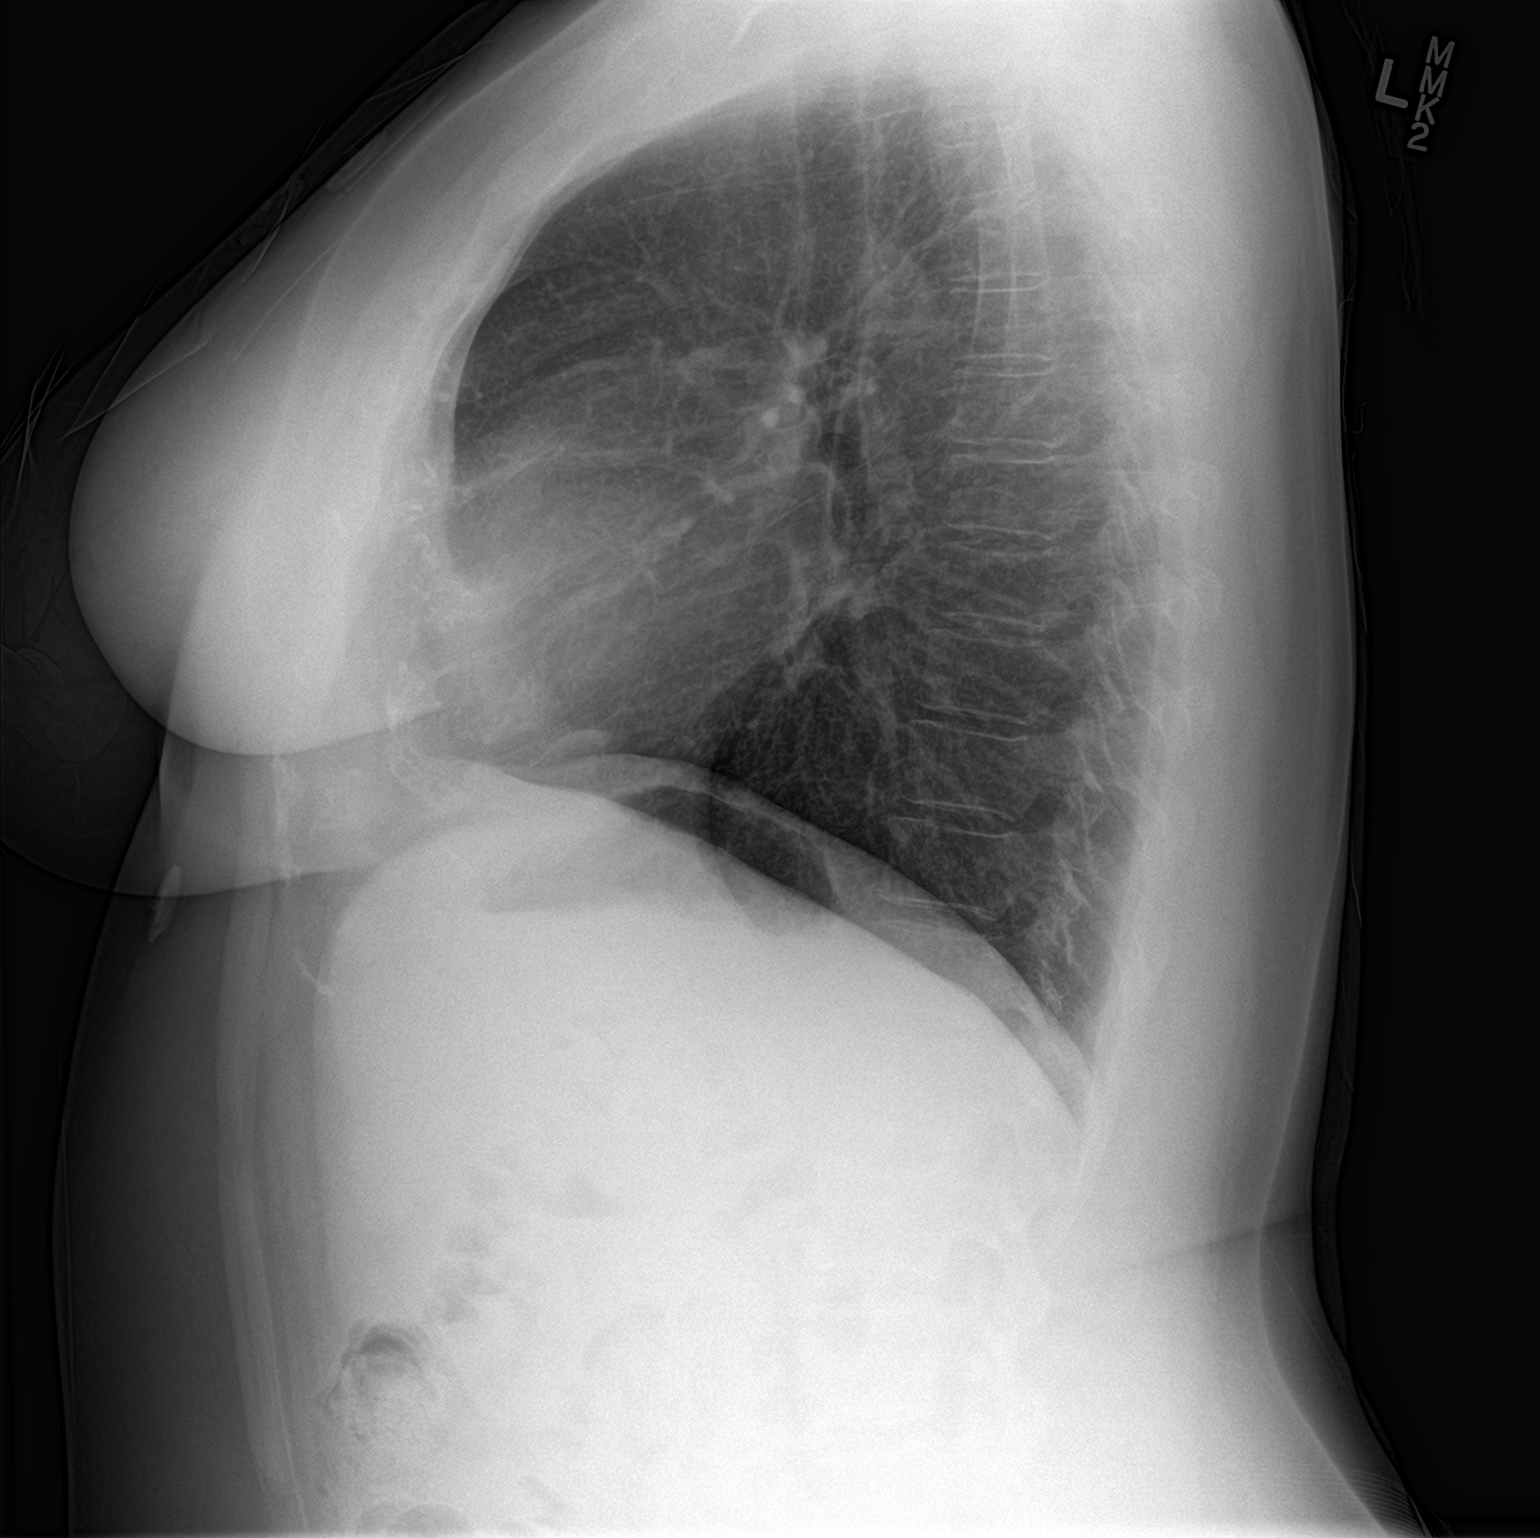

[2 of 2 positions shown; findings below may reference images not displayed]

FINDINGS: Heart and mediastinal contours are within normal limits. No focal
opacities or effusions. No acute bony abnormality.
IMPRESSION: No active cardiopulmonary disease.

## 2020-02-04 ENCOUNTER — Telehealth: Payer: Self-pay | Admitting: *Deleted

## 2020-02-04 NOTE — Telephone Encounter (Signed)
Unable to leave a message, no voicemail.  

## 2020-02-17 ENCOUNTER — Other Ambulatory Visit: Payer: Self-pay | Admitting: Cardiology

## 2020-02-23 ENCOUNTER — Telehealth (INDEPENDENT_AMBULATORY_CARE_PROVIDER_SITE_OTHER): Payer: Self-pay | Admitting: Cardiology

## 2020-02-23 ENCOUNTER — Encounter: Payer: Self-pay | Admitting: Cardiology

## 2020-02-23 VITALS — Ht 68.0 in | Wt 198.0 lb

## 2020-02-23 DIAGNOSIS — R0683 Snoring: Secondary | ICD-10-CM

## 2020-02-23 DIAGNOSIS — E663 Overweight: Secondary | ICD-10-CM

## 2020-02-23 DIAGNOSIS — R002 Palpitations: Secondary | ICD-10-CM

## 2020-02-23 DIAGNOSIS — F17200 Nicotine dependence, unspecified, uncomplicated: Secondary | ICD-10-CM

## 2020-02-23 DIAGNOSIS — Z72820 Sleep deprivation: Secondary | ICD-10-CM

## 2020-02-23 DIAGNOSIS — R079 Chest pain, unspecified: Secondary | ICD-10-CM

## 2020-02-23 MED ORDER — PROPRANOLOL HCL 10 MG PO TABS: 10.0000 mg | ORAL_TABLET | Freq: Three times a day (TID) | ORAL | 3 refills | Status: DC
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
## 2020-02-23 NOTE — Patient Instructions (Signed)
Medication Instructions:  Your physician recommends that you continue on your current medications as directed. Please refer to the Current Medication list given to you today.  *If you need a refill on your cardiac medications before your next appointment, please call your pharmacy*   Testing/Procedures: Your physician has recommended that you have a Home sleep study. This test records several body functions during sleep, including: brain activity, eye movement, oxygen and carbon dioxide blood levels, heart rate and rhythm, breathing rate and rhythm, the flow of air through your mouth and nose, snoring, body muscle movements, and chest and belly movement. Burna Mortimer, CMA will call you with information about your sleep study.  Follow-Up: At [facility], you and your health needs are our priority.  As part of our continuing mission to provide you with exceptional heart care, we have created designated Provider Care Teams.  These Care Teams include your primary Cardiologist (physician) and Advanced Practice Providers (APPs -  Physician Assistants and Nurse Practitioners) who all work together to provide you with the care you need, when you need it.  Your next appointment:   12 month(s)  The format for your next appointment:   In Person  Provider:   Olga Millers, MD  Other Instructions Please call our office two months ahead of time to schedule your follow-up appointment.

## 2020-02-25 ENCOUNTER — Other Ambulatory Visit: Payer: Self-pay | Admitting: Cardiology

## 2020-02-25 DIAGNOSIS — E663 Overweight: Secondary | ICD-10-CM

## 2020-02-25 DIAGNOSIS — R0683 Snoring: Secondary | ICD-10-CM

## 2020-02-25 DIAGNOSIS — Z72820 Sleep deprivation: Secondary | ICD-10-CM

## 2020-02-25 DIAGNOSIS — F17200 Nicotine dependence, unspecified, uncomplicated: Secondary | ICD-10-CM

## 2020-02-25 DIAGNOSIS — R002 Palpitations: Secondary | ICD-10-CM

## 2020-02-26 ENCOUNTER — Telehealth: Payer: Self-pay | Admitting: *Deleted

## 2020-02-26 NOTE — Telephone Encounter (Signed)
Patient notified of home sleep study appointment. 

## 2020-04-28 ENCOUNTER — Encounter (HOSPITAL_BASED_OUTPATIENT_CLINIC_OR_DEPARTMENT_OTHER): Payer: Self-pay | Admitting: Cardiovascular Disease

## 2020-05-14 ENCOUNTER — Emergency Department (HOSPITAL_COMMUNITY)
Admission: EM | Admit: 2020-05-14 | Discharge: 2020-05-14 | Disposition: A | Discharge status: Home / Self Care | Payer: Self-pay | Attending: Emergency Medicine | Admitting: Emergency Medicine

## 2020-05-14 ENCOUNTER — Other Ambulatory Visit: Payer: Self-pay

## 2020-05-14 ENCOUNTER — Emergency Department (HOSPITAL_COMMUNITY): Payer: Self-pay

## 2020-05-14 ENCOUNTER — Encounter (HOSPITAL_COMMUNITY): Payer: Self-pay | Admitting: *Deleted

## 2020-05-14 DIAGNOSIS — M25512 Pain in left shoulder: Secondary | ICD-10-CM | POA: Insufficient documentation

## 2020-05-14 DIAGNOSIS — R200 Anesthesia of skin: Secondary | ICD-10-CM | POA: Insufficient documentation

## 2020-05-14 DIAGNOSIS — R079 Chest pain, unspecified: Secondary | ICD-10-CM | POA: Insufficient documentation

## 2020-05-14 DIAGNOSIS — R202 Paresthesia of skin: Secondary | ICD-10-CM | POA: Insufficient documentation

## 2020-05-14 DIAGNOSIS — Z79899 Other long term (current) drug therapy: Secondary | ICD-10-CM | POA: Insufficient documentation

## 2020-05-14 DIAGNOSIS — Z87891 Personal history of nicotine dependence: Secondary | ICD-10-CM | POA: Insufficient documentation

## 2020-05-14 LAB — BASIC METABOLIC PANEL
Anion gap: 10 (ref 5–15)
BUN: 18 mg/dL (ref 6–20)
CO2: 23 mmol/L (ref 22–32)
Calcium: 9.2 mg/dL (ref 8.9–10.3)
Chloride: 107 mmol/L (ref 98–111)
Creatinine, Ser: 0.81 mg/dL (ref 0.44–1.00)
GFR calc Af Amer: >60 mL/min (ref >60)
GFR calc non Af Amer: >60 mL/min (ref >60)
Glucose, Bld: 101 mg/dL — ABNORMAL HIGH (ref 70–99)
Potassium: 4.4 mmol/L (ref 3.5–5.1)
Sodium: 140 mmol/L (ref 135–145)

## 2020-05-14 LAB — CBC
HCT: 44.5 % (ref 36.0–46.0)
Hemoglobin: 14.5 g/dL (ref 12.0–15.0)
MCH: 30.3 pg (ref 26.0–34.0)
MCHC: 32.6 g/dL (ref 30.0–36.0)
MCV: 93.1 fL (ref 80.0–100.0)
Platelets: 252 10*3/uL (ref 150–400)
RBC: 4.78 MIL/uL (ref 3.87–5.11)
RDW: 12.2 % (ref 11.5–15.5)
WBC: 7.9 10*3/uL (ref 4.0–10.5)
nRBC: 0 % (ref 0.0–0.2)

## 2020-05-14 LAB — TROPONIN I (HIGH SENSITIVITY): Troponin I (High Sensitivity): <2 ng/L (ref <18)

## 2020-05-14 LAB — I-STAT BETA HCG BLOOD, ED (MC, WL, AP ONLY): I-stat hCG, quantitative: <5 m[IU]/mL (ref <5)

## 2020-05-14 MED ORDER — SODIUM CHLORIDE 0.9% FLUSH: 3.0000 mL | Freq: Once | INTRAVENOUS | Status: DC

## 2020-05-14 NOTE — ED Notes (Signed)
Patient verbalizes understanding of discharge instructions. Opportunity for questioning and answers were provided. Armband removed by staff, pt discharged from ED ambulatory by self\  

## 2020-05-14 NOTE — Discharge Instructions (Signed)
Take 4 over the counter ibuprofen tablets 3 times a day or 2 over-the-counter naproxen tablets twice a day for pain. Also take tylenol 1000mg(2 extra strength) four times a day.    

## 2020-05-14 NOTE — ED Triage Notes (Signed)
THE PT IS C/O LT SIDED CHEST LT SHOULDER AND LT ARM SINCE YESTERDAY   NO ACUTE DISTRESS
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
## 2020-05-18 ENCOUNTER — Telehealth: Payer: Self-pay | Admitting: Cardiology

## 2020-05-18 NOTE — Telephone Encounter (Signed)
Per pt went to ED on Sat for left arm pain tingling and throbbing  was told was (muscular and skeletal )Per pt since then has noted extreme fatigue slept for 20 hours on Sunday and was ready to sleep again after only being up a couple of hours  B/P was 127/85 Instructed pt to contact PCP may need additional lab work Will forward to Dr Jens Som for review and recommendations .Zack Seal

## 2020-05-18 NOTE — Telephone Encounter (Signed)
Agree with plan Alvita Fana  

## 2020-05-18 NOTE — Telephone Encounter (Signed)
New Message  Patient c/o Palpitations:  High priority if patient c/o lightheadedness, shortness of breath, or chest pain  1) How long have you had palpitations/irregular HR/ Afib? Are you having the symptoms now? No  2) Are you currently experiencing lightheadedness, SOB or CP? lightheadedness  3) Do you have a history of afib (atrial fibrillation) or irregular heart rhythm? No  4) Have you checked your BP or HR? (document readings if available): No  5) Are you experiencing any other symptoms? Extreme fatigue and tiredness

## 2020-08-05 ENCOUNTER — Other Ambulatory Visit: Payer: Self-pay | Admitting: Cardiology

## 2021-05-29 ENCOUNTER — Other Ambulatory Visit: Payer: Self-pay | Admitting: Cardiology
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
## 2021-09-08 ENCOUNTER — Other Ambulatory Visit: Payer: Self-pay

## 2021-09-08 ENCOUNTER — Ambulatory Visit: Payer: 59 | Admitting: Adult Health

## 2021-09-08 ENCOUNTER — Encounter: Payer: Self-pay | Admitting: Adult Health

## 2021-09-08 VITALS — BP 114/92 | HR 79 | Ht 67.0 in | Wt 221.2 lb

## 2021-09-08 DIAGNOSIS — Q528 Other specified congenital malformations of female genitalia: Secondary | ICD-10-CM

## 2021-09-08 DIAGNOSIS — R002 Palpitations: Secondary | ICD-10-CM | POA: Diagnosis not present

## 2021-09-08 DIAGNOSIS — Z131 Encounter for screening for diabetes mellitus: Secondary | ICD-10-CM

## 2021-09-08 DIAGNOSIS — Q8789 Other specified congenital malformation syndromes, not elsewhere classified: Secondary | ICD-10-CM

## 2021-09-08 MED ORDER — PROPRANOLOL HCL 10 MG PO TABS: 10.0000 mg | ORAL_TABLET | Freq: Three times a day (TID) | ORAL | 3 refills | Status: DC

## 2021-09-08 NOTE — Patient Instructions (Addendum)
Medication Instructions:   Your physician recommends that you continue on your current medications as directed. Please refer to the Current Medication list given to you today.   *If you need a refill on your cardiac medications before your next appointment, please call your pharmacy*   Lab Work: LABS TODAY TSH , VIT D , BMET , HGA1C, LIPIDS, AND LFT    If you have labs (blood work) drawn today and your tests are completely normal, you will receive your results only by: MyChart Message (if you have MyChart) OR A paper copy in the mail If you have any lab test that is abnormal or we need to change your treatment, we will call you to review the results.   Testing/Procedures: NONE ORDERED  TODAY   Follow-Up: At [facility], you and your health needs are our priority.  As part of our continuing mission to provide you with exceptional heart care, we have created designated Provider Care Teams.  These Care Teams include your primary Cardiologist (physician) and Advanced Practice Providers (APPs -  Physician Assistants and Nurse Practitioners) who all work together to provide you with the care you need, when you need it.  We recommend signing up for the patient portal called "MyChart".  Sign up information is provided on this After Visit Summary.  MyChart is used to connect with patients for Virtual Visits (Telemedicine).  Patients are able to view lab/test results, encounter notes, upcoming appointments, etc.  Non-urgent messages can be sent to your provider as well.   To learn more about what you can do with MyChart, go to [email or web address].    Your next appointment:   1 year(s)  The format for your next appointment:   In Person  Provider:   You may see Olga Millers, MD or one of the following Advanced Practice Providers on your designated Care Team:   [place], PA-C Edd Fabian, FNP   Other Instructions

## 2021-09-09 LAB — HEPATIC FUNCTION PANEL
ALT: 35 IU/L — ABNORMAL HIGH (ref 0–32)
AST: 18 IU/L (ref 0–40)
Albumin: 4.7 g/dL (ref 3.8–4.8)
Alkaline Phosphatase: 66 IU/L (ref 44–121)
Bilirubin Total: 0.3 mg/dL (ref 0.0–1.2)
Bilirubin, Direct: <0.1 mg/dL (ref 0.00–0.40)
Total Protein: 6.7 g/dL (ref 6.0–8.5)

## 2021-09-09 LAB — BASIC METABOLIC PANEL
BUN/Creatinine Ratio: 22 (ref 9–23)
BUN: 22 mg/dL (ref 6–24)
CO2: 23 mmol/L (ref 20–29)
Calcium: 9.9 mg/dL (ref 8.7–10.2)
Chloride: 101 mmol/L (ref 96–106)
Creatinine, Ser: 0.99 mg/dL (ref 0.57–1.00)
Glucose: 105 mg/dL — ABNORMAL HIGH (ref 65–99)
Potassium: 4.6 mmol/L (ref 3.5–5.2)
Sodium: 137 mmol/L (ref 134–144)
eGFR: 73 mL/min/{1.73_m2} (ref >59)

## 2021-09-09 LAB — CBC
Hematocrit: 42.9 % (ref 34.0–46.6)
Hemoglobin: 14.5 g/dL (ref 11.1–15.9)
MCH: 30.1 pg (ref 26.6–33.0)
MCHC: 33.8 g/dL (ref 31.5–35.7)
MCV: 89 fL (ref 79–97)
Platelets: 248 10*3/uL (ref 150–450)
RBC: 4.82 x10E6/uL (ref 3.77–5.28)
RDW: 12.4 % (ref 11.7–15.4)
WBC: 6.1 10*3/uL (ref 3.4–10.8)

## 2021-09-09 LAB — HEMOGLOBIN A1C
Est. average glucose Bld gHb Est-mCnc: 134 mg/dL
Hgb A1c MFr Bld: 6.3 % — ABNORMAL HIGH (ref 4.8–5.6)

## 2021-09-09 LAB — LIPID PANEL
Chol/HDL Ratio: 3.8 ratio (ref 0.0–4.4)
Cholesterol, Total: 199 mg/dL (ref 100–199)
HDL: 53 mg/dL (ref >39)
LDL Chol Calc (NIH): 116 mg/dL — ABNORMAL HIGH (ref 0–99)
Triglycerides: 172 mg/dL — ABNORMAL HIGH (ref 0–149)
VLDL Cholesterol Cal: 30 mg/dL (ref 5–40)

## 2021-09-09 LAB — TSH: TSH: 0.734 u[IU]/mL (ref 0.450–4.500)

## 2021-09-09 LAB — VITAMIN D 25 HYDROXY (VIT D DEFICIENCY, FRACTURES): Vit D, 25-Hydroxy: 20.6 ng/mL — ABNORMAL LOW (ref 30.0–100.0)

## 2021-09-13 ENCOUNTER — Telehealth: Payer: Self-pay

## 2021-09-13 NOTE — Telephone Encounter (Addendum)
Unable to leave message voicemail full.----- Message from Jodelle Gross, NP sent at 09/10/2021  2:59 PM EDT ----- I have reviewed her labs.  Essentially normal, with TSH trending down but not indicative of disease.  Her Vitamin D level is low.  I recommend that she take a Vitamin D supplement OTC.  Hgb A1c is elevated as well. She has been referred to Dr. Yetta Barre as a new PCP. He may want to do further testing on diabetes evaluation.  Not sure when her appointment is scheduled. She is not on any medications but propanolol. No other recommendations at this time.

## 2022-05-14 ENCOUNTER — Ambulatory Visit: Payer: 59 | Admitting: Family Medicine

## 2022-05-14 ENCOUNTER — Encounter: Payer: Self-pay | Admitting: Family Medicine

## 2022-05-14 VITALS — BP 120/70 | HR 93 | Wt 215.4 lb

## 2022-05-14 DIAGNOSIS — Z7689 Persons encountering health services in other specified circumstances: Secondary | ICD-10-CM | POA: Diagnosis not present

## 2022-05-14 DIAGNOSIS — L989 Disorder of the skin and subcutaneous tissue, unspecified: Secondary | ICD-10-CM

## 2022-05-14 DIAGNOSIS — G4700 Insomnia, unspecified: Secondary | ICD-10-CM

## 2022-05-14 DIAGNOSIS — R7303 Prediabetes: Secondary | ICD-10-CM

## 2022-05-14 DIAGNOSIS — Z8639 Personal history of other endocrine, nutritional and metabolic disease: Secondary | ICD-10-CM

## 2022-05-14 LAB — POCT GLYCOSYLATED HEMOGLOBIN (HGB A1C): Hemoglobin A1C: 6 % — AB (ref 4.0–5.6)

## 2022-05-14 MED ORDER — HYDROXYZINE HCL 25 MG PO TABS: 25.0000 mg | ORAL_TABLET | Freq: Every evening | ORAL | 0 refills | Status: AC

## 2022-05-14 NOTE — Assessment & Plan Note (Signed)
A1c today 6, improved from prior which was 6.3. ?- Consider medical management with metformin ?

## 2022-05-14 NOTE — Assessment & Plan Note (Addendum)
Complicated by extremely long work hours.  Patient with no worklife balance.  Awakens multiple times during the night for urination.  Considered trial of trazodone but patient has a history of bipolar disorder as a teenager. ?- Trial of hydroxyzine ?- Follow-up in 1-2 weeks ?- Consider nocturia treatment if no improvement ?
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
## 2022-05-14 NOTE — Patient Instructions (Signed)
We are checking her A1c today.  If it is diabetic then I will recommend different medications.  I do want [place] to follow-up in the next 1 to 2 weeks. ? ?We will have to do a couple of close follow-ups to get all of your problems addressed. ? ?I am sending in a medication called trazodone that I want you to try and use for your sleep, we will follow-up and see if this is not any benefit for you. ? ?

## 2022-06-05 ENCOUNTER — Encounter: Payer: Self-pay | Admitting: *Deleted

## 2022-06-08 ENCOUNTER — Ambulatory Visit: Payer: 59 | Admitting: Family Medicine

## 2022-06-18 ENCOUNTER — Ambulatory Visit: Payer: 59 | Admitting: Family Medicine

## 2022-07-11 ENCOUNTER — Other Ambulatory Visit: Payer: Self-pay | Admitting: Cardiology

## 2022-07-11 NOTE — Telephone Encounter (Signed)
*  STAT* If patient is at the pharmacy, call can be transferred to refill team.   1. Which medications need to be refilled? (please list name of each medication and dose if known)  propranolol (INDERAL) 10 MG tablet Take 1 tablet (10 mg total) by mouth once daily.  Patient states she has only been taking the medication once daily  2. Which pharmacy/location (including street and city if local pharmacy) is medication to be sent to? [id number] [place] [address], [place] - [address]  3. Do they need a 30 day or 90 day supply?  90 day supply

## 2022-07-12 MED ORDER — PROPRANOLOL HCL 10 MG PO TABS: 10.0000 mg | ORAL_TABLET | Freq: Three times a day (TID) | ORAL | 3 refills | Status: DC
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
## 2022-09-13 ENCOUNTER — Encounter: Payer: Self-pay | Admitting: Student

## 2022-09-13 ENCOUNTER — Ambulatory Visit: Payer: 59 | Attending: Student | Admitting: Cardiology

## 2022-09-13 VITALS — BP 122/74 | HR 96 | Ht 68.0 in | Wt 229.0 lb

## 2022-09-13 DIAGNOSIS — R002 Palpitations: Secondary | ICD-10-CM | POA: Diagnosis not present

## 2022-09-13 DIAGNOSIS — Z6834 Body mass index (BMI) 34.0-34.9, adult: Secondary | ICD-10-CM | POA: Diagnosis not present

## 2022-09-13 DIAGNOSIS — R000 Tachycardia, unspecified: Secondary | ICD-10-CM

## 2022-09-13 DIAGNOSIS — R7303 Prediabetes: Secondary | ICD-10-CM | POA: Diagnosis not present

## 2022-09-13 MED ORDER — PROPRANOLOL HCL 10 MG PO TABS: 10.0000 mg | ORAL_TABLET | Freq: Three times a day (TID) | ORAL | 3 refills | Status: DC

## 2022-09-13 NOTE — Patient Instructions (Signed)
Medication Instructions:  Your physician recommends that you continue on your current medications as directed. Please refer to the Current Medication list given to you today.  *If you need a refill on your cardiac medications before your next appointment, please call your pharmacy*   Lab Work: NONE ordered at this time of appointment  If you have labs (blood work) drawn today and your tests are completely normal, you will receive your results only by: MyChart Message (if you have MyChart) OR A paper copy in the mail If you have any lab test that is abnormal or we need to change your treatment, we will call you to review the results.   Testing/Procedures: NONE ordered at this time of appointment    Follow-Up: At [facility], you and your health needs are our priority.  As part of our continuing mission to provide you with exceptional heart care, we have created designated Provider Care Teams.  These Care Teams include your primary Cardiologist (physician) and Advanced Practice Providers (APPs -  Physician Assistants and Nurse Practitioners) who all work together to provide you with the care you need, when you need it.  We recommend signing up for the patient portal called "MyChart".  Sign up information is provided on this After Visit Summary.  MyChart is used to connect with patients for Virtual Visits (Telemedicine).  Patients are able to view lab/test results, encounter notes, upcoming appointments, etc.  Non-urgent messages can be sent to your provider as well.   To learn more about what you can do with MyChart, go to [email or web address].    Your next appointment:   1 year(s)  The format for your next appointment:   In Person  Provider:   Brian Crenshaw, MD   

## 2023-01-29 ENCOUNTER — Other Ambulatory Visit: Payer: Self-pay | Admitting: Family Medicine

## 2023-01-29 DIAGNOSIS — Z1231 Encounter for screening mammogram for malignant neoplasm of breast: Secondary | ICD-10-CM

## 2023-02-19 ENCOUNTER — Ambulatory Visit
Admission: RE | Admit: 2023-02-19 | Discharge: 2023-02-19 | Disposition: A | Discharge status: Home / Self Care | Payer: 59 | Source: Ambulatory Visit | Attending: Family Medicine | Admitting: Family Medicine

## 2023-02-19 DIAGNOSIS — Z1231 Encounter for screening mammogram for malignant neoplasm of breast: Secondary | ICD-10-CM
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
## 2024-02-26 ENCOUNTER — Encounter: Payer: Self-pay | Admitting: General Practice

## 2024-02-26 ENCOUNTER — Ambulatory Visit: Payer: 59 | Attending: General Practice | Admitting: General Practice

## 2024-02-26 VITALS — BP 118/86 | HR 77 | Ht 68.0 in | Wt 229.8 lb

## 2024-02-26 DIAGNOSIS — Z6834 Body mass index (BMI) 34.0-34.9, adult: Secondary | ICD-10-CM

## 2024-02-26 DIAGNOSIS — G479 Sleep disorder, unspecified: Secondary | ICD-10-CM

## 2024-02-26 DIAGNOSIS — R7303 Prediabetes: Secondary | ICD-10-CM | POA: Diagnosis not present

## 2024-02-26 DIAGNOSIS — R000 Tachycardia, unspecified: Secondary | ICD-10-CM | POA: Diagnosis not present

## 2024-02-26 DIAGNOSIS — R002 Palpitations: Secondary | ICD-10-CM | POA: Diagnosis not present

## 2024-02-26 MED ORDER — PROPRANOLOL HCL 10 MG PO TABS: 10.0000 mg | ORAL_TABLET | Freq: Three times a day (TID) | ORAL | 3 refills | Status: AC | PRN

## 2024-02-26 NOTE — Patient Instructions (Signed)
 Medication Instructions:  The current medical regimen is effective;  continue present plan and medications as directed. Please refer to the Current Medication list given to you today.   *If you need a refill on your cardiac medications before your next appointment, please call your pharmacy*  Lab Work: NONE  Other Instructions PLEASE READ AND FOLLOW ATTACHED SLEEP TIPS Please try to avoid these triggers: Do not use any products that have nicotine or tobacco in them. These include cigarettes, e-cigarettes, and chewing tobacco. If you need help quitting, ask your doctor. Eat heart-healthy foods. Talk with your doctor about the right eating plan for you. Exercise regularly as told by your doctor. Stay hydrated Do not drink alcohol, Caffeine or chocolate. Lose weight if you are overweight. Do not use drugs, including cannabis  Follow-Up: At [facility], you and your health needs are our priority.  As part of our continuing mission to provide you with exceptional heart care, we have created designated Provider Care Teams.  These Care Teams include your primary Cardiologist (physician) and Advanced Practice Providers (APPs -  Physician Assistants and Nurse Practitioners) who all work together to provide you with the care you need, when you need it.  Your next appointment:   12 month(s)  Provider:   Olga Millers, MD  or Edd Fabian, FNP             Quality Sleep Information, (sleep tips) Quality sleep is important for your mental and physical health. It also improves your quality of life. Quality sleep means you: Are asleep for most of the time you are in bed. Fall asleep within 30 minutes. Wake up no more than once a night. Are awake for no longer than 20 minutes if you do wake up during the night. Most adults need 7-8 hours of quality sleep each night. How can poor sleep affect me? If you do not get enough quality sleep, you may have: Mood swings. Daytime  sleepiness. Decreased alertness, reaction time, and concentration. Sleep disorders, such as insomnia and sleep apnea. Difficulty with: Solving problems. Coping with stress. Paying attention. These issues may affect your performance and productivity at work, school, and home. Lack of sleep may also put you at higher risk for accidents, suicide, and risky behaviors. If you do not get quality sleep, you may also be at higher risk for several health problems, including: Infections. Type 2 diabetes. Heart disease. High blood pressure. Obesity. Worsening of long-term conditions, like arthritis, kidney disease, depression, Parkinson's disease, and epilepsy. What actions can I take to get more quality sleep? Sleep schedule and routine Stick to a sleep schedule. Go to sleep and wake up at about the same time each day. Do not try to sleep less on weekdays and make up for lost sleep on weekends. This does not work. Limit naps during the day to 30 minutes or less. Do not take naps in the late afternoon. Make time to relax before bed. Reading, listening to music, or taking a hot bath promotes quality sleep. Make your bedroom a place that promotes quality sleep. Keep your bedroom dark, quiet, and at a comfortable room temperature. Make sure your bed is comfortable. Avoid using electronic devices that give off bright blue light for 30 minutes before bedtime. Your brain perceives bright blue light as sunlight. This includes television, phones, and computers. If you are lying awake in bed for longer than 20 minutes, get up and do a relaxing activity until you feel sleepy. Lifestyle  Try to get at least 30 minutes of exercise on most days. Do not exercise 2-3 hours before going to bed. Do not use any products that contain nicotine or tobacco. These products include cigarettes, chewing tobacco, and vaping devices, such as e-cigarettes. If you need help quitting, ask your health care provider. Do not  drink caffeinated beverages for at least 8 hours before going to bed. Coffee, tea, and some sodas contain caffeine. Do not drink alcohol or eat large meals close to bedtime. Try to get at least 30 minutes of sunlight every day. Morning sunlight is best. Medical concerns Work with your health care provider to treat medical conditions that may affect sleeping, such as: Nasal obstruction. Snoring. Sleep apnea and other sleep disorders. Talk to your health care provider if you think any of your prescription medicines may cause you to have difficulty falling or staying asleep. If you have sleep problems, talk with a sleep consultant. If you think you have a sleep disorder, talk with your health care provider about getting evaluated by a specialist. Where to find more information Sleep Foundation: sleepfoundation.org American Academy of Sleep Medicine: aasm.org Centers for Disease Control and Prevention (CDC): TonerPromos.no Contact a health care provider if: You have trouble getting to sleep or staying asleep. You often wake up very early in the morning and cannot get back to sleep. You have daytime sleepiness. You have daytime sleep attacks of suddenly falling asleep and sudden muscle weakness (narcolepsy). You have a tingling sensation in your legs with a strong urge to move your legs (restless legs syndrome). You stop breathing briefly during sleep (sleep apnea). You think you have a sleep disorder or are taking a medicine that is affecting your quality of sleep. Summary Most adults need 7-8 hours of quality sleep each night. Getting enough quality sleep is important for your mental and physical health. Make your bedroom a place that promotes quality sleep, and avoid things that may cause you to have poor sleep, such as alcohol, caffeine, smoking, or large meals. Talk to your health care provider if you have trouble falling asleep or staying asleep. This information is not intended to replace  advice given to you by your health care provider. Make sure you discuss any questions you have with your health care provider. Document Revised: 04/11/2022 Document Reviewed: 04/11/2022 Elsevier Patient Education  2024 [employer].

## 2024-03-20 ENCOUNTER — Other Ambulatory Visit: Payer: Self-pay | Admitting: Family Medicine

## 2024-03-20 DIAGNOSIS — Z1231 Encounter for screening mammogram for malignant neoplasm of breast: Secondary | ICD-10-CM

## 2024-04-02 ENCOUNTER — Ambulatory Visit
Admission: RE | Admit: 2024-04-02 | Discharge: 2024-04-02 | Disposition: A | Discharge status: Home / Self Care | Source: Ambulatory Visit | Attending: Family Medicine

## 2024-04-02 DIAGNOSIS — Z1231 Encounter for screening mammogram for malignant neoplasm of breast: Secondary | ICD-10-CM

## 2024-04-08 ENCOUNTER — Other Ambulatory Visit: Payer: Self-pay | Admitting: Family Medicine

## 2024-04-08 DIAGNOSIS — R928 Other abnormal and inconclusive findings on diagnostic imaging of breast: Secondary | ICD-10-CM

## 2024-04-21 ENCOUNTER — Ambulatory Visit
Admission: RE | Admit: 2024-04-21 | Discharge: 2024-04-21 | Disposition: A | Discharge status: Home / Self Care | Source: Ambulatory Visit | Attending: Family Medicine | Admitting: Family Medicine

## 2024-04-21 DIAGNOSIS — R928 Other abnormal and inconclusive findings on diagnostic imaging of breast: Secondary | ICD-10-CM
